# Patient Record
Sex: Female | Born: 1995 | Race: Black or African American | Hispanic: No | Smoking: Former smoker
Health system: Southern US, Community
[De-identification: ages and names within clinical notes are randomized; demographics above are authoritative.]

## PROBLEM LIST (undated history)

## (undated) DIAGNOSIS — D649 Anemia, unspecified: Secondary | ICD-10-CM

## (undated) DIAGNOSIS — O139 Gestational [pregnancy-induced] hypertension without significant proteinuria, unspecified trimester: Secondary | ICD-10-CM

## (undated) DIAGNOSIS — I10 Essential (primary) hypertension: Secondary | ICD-10-CM

## (undated) HISTORY — DX: Anemia, unspecified: D64.9

## (undated) HISTORY — PX: NO PAST SURGERIES: SHX2092

## (undated) HISTORY — DX: Gestational (pregnancy-induced) hypertension without significant proteinuria, unspecified trimester: O13.9

## (undated) HISTORY — DX: Essential (primary) hypertension: I10

---

## 2020-03-27 NOTE — L&D Delivery Note (Signed)
Delivery Note Arrived on stretcher with urge to push.  Reluctant to move to bed.  AROM light Meconium stained fluid.  Finally convinced her to move to bed where she quickly delivered with NICU attending due to MSAF.  At 2:15 AM a viable and healthy female was delivered via Vaginal, Spontaneous (Presentation: Left Occiput Anterior). Delivered en caul.   Baby covered in thick meconium, slow to cry.  Cord clamped and cut and baby handed to [facility] team for care.   They provided care and took him to NICU.  APGAR: 7, 9; weight 6 lb 12.3 oz (3070 g).   Placenta status: Spontaneous;Pathology, Intact.  Cord: 3 vessels with the following complications: None.  Cord pH: 7.386  Anesthesia: None Episiotomy: None Lacerations: None Suture Repair:  none Est. Blood Loss (mL): 500  Mom to postpartum.  Baby to NICU.  Wynelle Bourgeois 03/20/2021, 3:36 AM  Please schedule this patient for Postpartum visit in: 1 week with the following provider: Any provider In-Person For C/S patients schedule nurse incision check in weeks 2 weeks: no High risk pregnancy complicated by: HTN Delivery mode:  SVD Anticipated Birth Control:  Nexplanon PP Procedures needed: BP check  Edinburgh:  pending, but has history of recent suicidal ideations and depression.   Schedule Integrated BH visit: yes  NICU relevant baby issues

## 2020-07-28 ENCOUNTER — Inpatient Hospital Stay (HOSPITAL_COMMUNITY): Payer: Medicaid Other

## 2020-07-28 ENCOUNTER — Inpatient Hospital Stay (HOSPITAL_COMMUNITY)
Admission: AD | Admit: 2020-07-28 | Discharge: 2020-07-28 | Disposition: A | Discharge status: Home / Self Care | Payer: Medicaid Other | Attending: Obstetrics & Gynecology | Admitting: Obstetrics & Gynecology

## 2020-07-28 ENCOUNTER — Encounter (HOSPITAL_COMMUNITY): Payer: Self-pay | Admitting: Obstetrics & Gynecology

## 2020-07-28 DIAGNOSIS — O219 Vomiting of pregnancy, unspecified: Secondary | ICD-10-CM

## 2020-07-28 DIAGNOSIS — O26811 Pregnancy related exhaustion and fatigue, first trimester: Secondary | ICD-10-CM | POA: Diagnosis not present

## 2020-07-28 DIAGNOSIS — R519 Headache, unspecified: Secondary | ICD-10-CM | POA: Diagnosis present

## 2020-07-28 DIAGNOSIS — O26891 Other specified pregnancy related conditions, first trimester: Secondary | ICD-10-CM

## 2020-07-28 DIAGNOSIS — O210 Mild hyperemesis gravidarum: Secondary | ICD-10-CM

## 2020-07-28 DIAGNOSIS — Z3490 Encounter for supervision of normal pregnancy, unspecified, unspecified trimester: Secondary | ICD-10-CM

## 2020-07-28 DIAGNOSIS — Z3A01 Less than 8 weeks gestation of pregnancy: Secondary | ICD-10-CM | POA: Diagnosis not present

## 2020-07-28 DIAGNOSIS — R825 Elevated urine levels of drugs, medicaments and biological substances: Secondary | ICD-10-CM

## 2020-07-28 DIAGNOSIS — R109 Unspecified abdominal pain: Secondary | ICD-10-CM

## 2020-07-28 DIAGNOSIS — F1990 Other psychoactive substance use, unspecified, uncomplicated: Secondary | ICD-10-CM

## 2020-07-28 DIAGNOSIS — O99891 Other specified diseases and conditions complicating pregnancy: Secondary | ICD-10-CM

## 2020-07-28 LAB — COMPREHENSIVE METABOLIC PANEL
ALT: 13 U/L (ref 0–44)
AST: 22 U/L (ref 15–41)
Albumin: 3.9 g/dL (ref 3.5–5.0)
Alkaline Phosphatase: 48 U/L (ref 38–126)
Anion gap: 9 (ref 5–15)
BUN: 9 mg/dL (ref 6–20)
CO2: 21 mmol/L — ABNORMAL LOW (ref 22–32)
Calcium: 9.3 mg/dL (ref 8.9–10.3)
Chloride: 105 mmol/L (ref 98–111)
Creatinine, Ser: 0.72 mg/dL (ref 0.44–1.00)
GFR, Estimated: >60 mL/min (ref >60)
Glucose, Bld: 82 mg/dL (ref 70–99)
Potassium: 3.9 mmol/L (ref 3.5–5.1)
Sodium: 135 mmol/L (ref 135–145)
Total Bilirubin: 1.3 mg/dL — ABNORMAL HIGH (ref 0.3–1.2)
Total Protein: 7.4 g/dL (ref 6.5–8.1)

## 2020-07-28 LAB — CBC
HCT: 38 % (ref 36.0–46.0)
Hemoglobin: 12.1 g/dL (ref 12.0–15.0)
MCH: 28.1 pg (ref 26.0–34.0)
MCHC: 31.8 g/dL (ref 30.0–36.0)
MCV: 88.4 fL (ref 80.0–100.0)
Platelets: 225 10*3/uL (ref 150–400)
RBC: 4.3 MIL/uL (ref 3.87–5.11)
RDW: 13.6 % (ref 11.5–15.5)
WBC: 5.7 10*3/uL (ref 4.0–10.5)
nRBC: 0 % (ref 0.0–0.2)

## 2020-07-28 LAB — HCG, QUANTITATIVE, PREGNANCY: hCG, Beta Chain, Quant, S: 41904 m[IU]/mL — ABNORMAL HIGH (ref <5)

## 2020-07-28 LAB — RAPID URINE DRUG SCREEN, HOSP PERFORMED
Amphetamines: POSITIVE — AB
Barbiturates: NOT DETECTED
Benzodiazepines: NOT DETECTED
Cocaine: NOT DETECTED
Opiates: NOT DETECTED
Tetrahydrocannabinol: POSITIVE — AB

## 2020-07-28 LAB — URINALYSIS, ROUTINE W REFLEX MICROSCOPIC
Bilirubin Urine: NEGATIVE
Glucose, UA: NEGATIVE mg/dL
Hgb urine dipstick: NEGATIVE
Ketones, ur: NEGATIVE mg/dL
Nitrite: NEGATIVE
Protein, ur: 30 mg/dL — AB
Specific Gravity, Urine: 1.033 — ABNORMAL HIGH (ref 1.005–1.030)
pH: 5 (ref 5.0–8.0)

## 2020-07-28 LAB — WET PREP, GENITAL
Sperm: NONE SEEN
Trich, Wet Prep: NONE SEEN
Yeast Wet Prep HPF POC: NONE SEEN

## 2020-07-28 LAB — ABO/RH: ABO/RH(D): A POS

## 2020-07-28 LAB — POCT PREGNANCY, URINE: Preg Test, Ur: POSITIVE — AB

## 2020-07-28 MED ORDER — PYRIDOXINE HCL 25 MG PO TABS: 25.0000 mg | ORAL_TABLET | Freq: Three times a day (TID) | ORAL | 0 refills | Status: AC

## 2020-07-28 MED ORDER — SODIUM CHLORIDE 0.9 % IV SOLN
25.0000 mg | Freq: Once | INTRAVENOUS | Status: AC
  Administered 2020-07-28: 25 mg via INTRAVENOUS
  Filled 2020-07-28: qty 1

## 2020-07-28 MED ORDER — LACTATED RINGERS IV BOLUS
1000.0000 mL | Freq: Once | INTRAVENOUS | Status: AC
  Administered 2020-07-28: 1000 mL via INTRAVENOUS

## 2020-07-28 MED ORDER — PROMETHAZINE HCL 12.5 MG PO TABS: 12.5000 mg | ORAL_TABLET | Freq: Four times a day (QID) | ORAL | 0 refills | Status: DC | PRN

## 2020-07-28 MED ORDER — DOXYLAMINE SUCCINATE (SLEEP) 25 MG PO TABS: 25.0000 mg | ORAL_TABLET | Freq: Three times a day (TID) | ORAL | 0 refills | Status: DC | PRN

## 2020-07-28 NOTE — MAU Provider Note (Signed)
History     CSN: [id number]  Arrival date and time: 07/28/20 1345   Event Date/Time   First Provider Initiated Contact with Patient 07/28/20 1440      Chief Complaint  Patient presents with  . Headache  . Fatigue  . Possible Pregnancy   HPI Nichole Barrett is a 25 y.o. G1P0 at [redacted]w[redacted]d who presents to MAU with chief complaints of headache, fatigue, nausea and vomiting in the setting of positive home pregnancy test. She denies abdominal pain, vaginal bleeding, dysuria, fever.  Headache The is a recurrent problem. Pain score on arrival to MAU is 9/10. She attempted to manage her pain with "non-Aspirin" but did not experience relief. Patient noted to be extremely somnolent, intermittently sleeping in chair in MAU waiting room.   Nausea and Vomiting New problem, has vomited twice today. No access to antiemetics, no other treaments. Unable to tolerate anything PO.  Fatigue This is a new problem, onset with pregnancy.   OB History    Gravida  1   Para      Term      Preterm      AB      Living        SAB      IAB      Ectopic      Multiple      Live Births             No family history on file.     Allergies: No Known Allergies  No medications prior to admission.    Review of Systems  Constitutional: Positive for fatigue.  Gastrointestinal: Positive for nausea and vomiting.  Neurological: Positive for headaches.  All other systems reviewed and are negative.  Physical Exam   Blood pressure (!) 110/58, pulse 76, temperature 98 F (36.7 C), temperature source Oral, resp. rate 16, height 5\' 7"  (1.702 m), weight 88.6 kg, last menstrual period 06/21/2020, SpO2 99 %.  Physical Exam Vitals and nursing note reviewed.  Cardiovascular:     Rate and Rhythm: Normal rate.     Heart sounds: Normal heart sounds.  Pulmonary:     Effort: Pulmonary effort is normal.     Breath sounds: Normal breath sounds.  Abdominal:     General: Bowel sounds are normal.      Palpations: Abdomen is soft.     Tenderness: There is no abdominal tenderness. There is no guarding.  Skin:    General: Skin is dry.     Capillary Refill: Capillary refill takes less than 2 seconds.  Neurological:     Mental Status: She is lethargic.    MAU Course  Procedures  Patient Vitals for the past 24 hrs:  BP Temp Temp src Pulse Resp SpO2 Height Weight  07/28/20 1735 121/64 -- -- 78 15 -- -- --  07/28/20 1406 (!) 110/58 98 F (36.7 C) Oral 76 16 99 % 5\' 7"  (1.702 m) 88.6 kg   Results for orders placed or performed during the hospital encounter of 07/28/20 (from the past 24 hour(s))  Pregnancy, urine POC     Status: Abnormal   Collection Time: 07/28/20  2:17 PM  Result Value Ref Range   Preg Test, Ur POSITIVE (A) NEGATIVE  CBC     Status: None   Collection Time: 07/28/20  2:58 PM  Result Value Ref Range   WBC 5.7 4.0 - 10.5 K/uL   RBC 4.30 3.87 - 5.11 MIL/uL   Hemoglobin 12.1 12.0 - 15.0 g/dL  HCT 38.0 36.0 - 46.0 %   MCV 88.4 80.0 - 100.0 fL   MCH 28.1 26.0 - 34.0 pg   MCHC 31.8 30.0 - 36.0 g/dL   RDW 09.8 11.9 - 14.7 %   Platelets 225 150 - 400 K/uL   nRBC 0.0 0.0 - 0.2 %  Comprehensive metabolic panel     Status: Abnormal   Collection Time: 07/28/20  2:58 PM  Result Value Ref Range   Sodium 135 135 - 145 mmol/L   Potassium 3.9 3.5 - 5.1 mmol/L   Chloride 105 98 - 111 mmol/L   CO2 21 (L) 22 - 32 mmol/L   Glucose, Bld 82 70 - 99 mg/dL   BUN 9 6 - 20 mg/dL   Creatinine, Ser 8.29 0.44 - 1.00 mg/dL   Calcium 9.3 8.9 - 56.2 mg/dL   Total Protein 7.4 6.5 - 8.1 g/dL   Albumin 3.9 3.5 - 5.0 g/dL   AST 22 15 - 41 U/L   ALT 13 0 - 44 U/L   Alkaline Phosphatase 48 38 - 126 U/L   Total Bilirubin 1.3 (H) 0.3 - 1.2 mg/dL   GFR, Estimated >13 >08 mL/min   Anion gap 9 5 - 15  hCG, quantitative, pregnancy     Status: Abnormal   Collection Time: 07/28/20  2:58 PM  Result Value Ref Range   hCG, Beta Chain, Quant, S 41,904 (H) <5 mIU/mL  Urinalysis, Routine w reflex  microscopic Urine, Clean Catch     Status: Abnormal   Collection Time: 07/28/20  2:59 PM  Result Value Ref Range   Color, Urine AMBER (A) YELLOW   APPearance HAZY (A) CLEAR   Specific Gravity, Urine 1.033 (H) 1.005 - 1.030   pH 5.0 5.0 - 8.0   Glucose, UA NEGATIVE NEGATIVE mg/dL   Hgb urine dipstick NEGATIVE NEGATIVE   Bilirubin Urine NEGATIVE NEGATIVE   Ketones, ur NEGATIVE NEGATIVE mg/dL   Protein, ur 30 (A) NEGATIVE mg/dL   Nitrite NEGATIVE NEGATIVE   Leukocytes,Ua SMALL (A) NEGATIVE   WBC, UA 0-5 0 - 5 WBC/hpf   Bacteria, UA FEW (A) NONE SEEN   Squamous Epithelial / LPF 6-10 0 - 5   Mucus PRESENT   Urine rapid drug screen (hosp performed)     Status: Abnormal   Collection Time: 07/28/20  2:59 PM  Result Value Ref Range   Opiates NONE DETECTED NONE DETECTED   Cocaine NONE DETECTED NONE DETECTED   Benzodiazepines NONE DETECTED NONE DETECTED   Amphetamines POSITIVE (A) NONE DETECTED   Tetrahydrocannabinol POSITIVE (A) NONE DETECTED   Barbiturates NONE DETECTED NONE DETECTED  ABO/Rh     Status: None   Collection Time: 07/28/20  2:59 PM  Result Value Ref Range   ABO/RH(D) A POS    No rh immune globuloin      NOT A RH IMMUNE GLOBULIN CANDIDATE, PT RH POSITIVE Performed at [facility], [address], [place], [place] [address]   Wet prep, genital     Status: Abnormal   Collection Time: 07/28/20  3:44 PM   Specimen: Vaginal  Result Value Ref Range   Yeast Wet Prep HPF POC NONE SEEN NONE SEEN   Trich, Wet Prep NONE SEEN NONE SEEN   Clue Cells Wet Prep HPF POC PRESENT (A) NONE SEEN   WBC, Wet Prep HPF POC MANY (A) NONE SEEN   Sperm NONE SEEN    US OB LESS THAN 14 WEEKS WITH OB TRANSVAGINAL  Result  Date: 07/28/2020 CLINICAL DATA:  Abdominal pain affecting first trimester of pregnancy, weakness, LMP 06/21/2020; no quantitative beta HCG yet available for correlation EXAM: OBSTETRIC <14 WK [place] AND TRANSVAGINAL OB US TECHNIQUE: Both transabdominal and transvaginal ultrasound  examinations were performed for complete evaluation of the gestation as well as the maternal uterus, adnexal regions, and pelvic cul-de-sac. Transvaginal technique was performed to assess early pregnancy. COMPARISON:  None FINDINGS: Intrauterine gestational sac: Present, single Yolk sac:  Present Embryo:  Present Cardiac Activity: Present Heart Rate: 117 bpm CRL:  3.9 mm   6 w   0 d                  [place] EDC: 03/23/2021 Subchorionic hemorrhage:  None visualized. Maternal uterus/adnexa: Uterus anteverted, otherwise normal appearance. RIGHT ovary normal size and morphology, 4.2 x 2.6 x 2.9 cm. LEFT ovary normal size and morphology, 3.6 x 1.8 x 2.1 cm. No free pelvic fluid or adnexal masses. IMPRESSION: Single live intrauterine gestation at 6 weeks 0 days EGA by crown-rump length. No acute abnormalities. Electronically Signed   By: Ulyses Southward M.D.   On: 07/28/2020 15:49   Meds ordered this encounter  Medications  . lactated ringers bolus 1,000 mL  . promethazine (PHENERGAN) 25 mg in sodium chloride 0.9 % 50 mL IVPB  . pyridOXINE (VITAMIN B-6) 25 MG tablet    Sig: Take 1 tablet (25 mg total) by mouth every 8 (eight) hours.    Dispense:  30 tablet    Refill:  0    Order Specific Question:   Supervising Provider    Answer:   Jaynie Collins A [[id number]]  . doxylamine, Sleep, (UNISOM) 25 MG tablet    Sig: Take 1 tablet (25 mg total) by mouth every 8 (eight) hours as needed.    Dispense:  30 tablet    Refill:  0    Order Specific Question:   Supervising Provider    Answer:   Jaynie Collins A [[id number]]  . promethazine (PHENERGAN) 12.5 MG tablet    Sig: Take 1 tablet (12.5 mg total) by mouth every 6 (six) hours as needed for nausea or vomiting.    Dispense:  30 tablet    Refill:  0    Order Specific Question:   Supervising Provider    Answer:   Jaynie Collins A [[id number]]   Assessment and Plan  --25 y.o. G1P0 with confirmed IUP at 6w 0d --Vomiting in first trimester without ketonuria --+ THC, + Amphetamines.  Pt denies use --New outpatient regimen to manage vomiting --Discussed Cannabis Hyperemesis --No episodes of vomiting in MAU --Pain score 0 at time of discharge, tolerating PO --Discharge home in stable condition  F/U: --Patient to select [facility] Provider. Given list of OBs with [facility] privileges   Calvert Cantor, CNM 07/28/2020, 8:41 PM

## 2020-07-28 NOTE — MAU Note (Signed)
Feels really, really weak (4-5 days).  Head is hurting (4-5 days). Just found out she is preg, +HPT x2. Denies bleeding. Denies pain except HA. Took 1 'non-aspirin'.  Denies hx of headaches.

## 2020-07-28 NOTE — Discharge Instructions (Signed)
Prenatal Care Providers           Center for [facility]  [address] [phone]  Center for [facility] @ [facility]   [address]  [phone]  [facility] @ [facility]       [address] [phone]            Center for [facility] @ [facility]     [phone]          Center for [facility] @ [facility]   [address] [phone]  Center for [facility] @ [facility]  [address] [phone]     Center for [facility] @ Family [address] [place])  [address]   [phone]     [facility]  Phone: [phone]  [facility]  Phone: [phone]  [facility] Phone: [phone]  [facility] Phone: [phone]  [facility] Phone: [phone]  [facility] Phone: [phone]  [facility]  Phone: [phone]  Safe Medications in Pregnancy   Acne: Benzoyl Peroxide Salicylic Acid  Backache/Headache: Tylenol: 2 regular strength every 4 hours OR              2 Extra strength every 6 hours  Colds/Coughs/Allergies: Benadryl (alcohol free) 25 mg every 6 hours as needed Breath right strips Claritin Cepacol throat lozenges Chloraseptic throat spray Cold-Eeze- up to three times per day Cough drops, alcohol free Flonase (by prescription only) Guaifenesin Mucinex Robitussin DM (plain only, alcohol free) Saline nasal spray/drops Sudafed (pseudoephedrine) & Actifed ** use only after [redacted] weeks gestation and if you do not have high blood pressure Tylenol Vicks Vaporub Zinc lozenges Zyrtec   Constipation: Colace Ducolax suppositories Fleet enema Glycerin suppositories Metamucil Milk of magnesia Miralax Senokot Smooth move tea  Diarrhea: Kaopectate Imodium A-D  *NO pepto  Bismol  Hemorrhoids: Anusol Anusol HC Preparation H Tucks  Indigestion: Tums Maalox Mylanta Zantac  Pepcid  Insomnia: Benadryl (alcohol free) 25mg  every 6 hours as needed Tylenol PM Unisom, no Gelcaps  Leg Cramps: Tums MagGel  Nausea/Vomiting:  Bonine Dramamine Emetrol Ginger extract Sea bands Meclizine  Nausea medication to take during pregnancy:  Unisom (doxylamine succinate 25 mg tablets) Take one tablet daily at bedtime. If symptoms are not adequately controlled, the dose can be increased to a maximum recommended dose of two tablets daily (1/2 tablet in the morning, 1/2 tablet mid-afternoon and one at bedtime). Vitamin B6 100mg  tablets. Take one tablet twice a day (up to 200 mg per day).  Skin Rashes: Aveeno products Benadryl cream or 25mg  every 6 hours as needed Calamine Lotion 1% cortisone cream  Yeast infection: Gyne-lotrimin 7 Monistat 7   **If taking multiple medications, please check labels to avoid duplicating the same active ingredients **take medication as directed on the label ** Do not exceed 4000 mg of tylenol in 24 hours **Do not take medications that contain aspirin or ibuprofen     .html">  First Trimester of Pregnancy  The first trimester of pregnancy starts on the first day of your last menstrual period until the end of week 12. This is also called months 1 through 3 of pregnancy. Body changes during your first trimester Your body goes through many changes during pregnancy. The changes usually return to normal after your baby is born. Physical changes  You may gain or  lose weight.  Your breasts may grow larger and hurt. The area around your nipples may get darker.  Dark spots or blotches may develop on your face.  You may have changes in your hair. Health changes  You may feel like you might vomit (nauseous), and you may vomit.  You may have heartburn.  You may have  headaches.  You may have trouble pooping (constipation).  Your gums may bleed. Other changes  You may get tired easily.  You may pee (urinate) more often.  Your menstrual periods will stop.  You may not feel hungry.  You may want to eat certain kinds of food.  You may have changes in your emotions from day to day.  You may have more dreams. Follow these instructions at home: Medicines  Take over-the-counter and prescription medicines only as told by your doctor. Some medicines are not safe during pregnancy.  Take a prenatal vitamin that contains at least 600 micrograms (mcg) of folic acid. Eating and drinking  Eat healthy meals that include: ? Fresh fruits and vegetables. ? Whole grains. ? Good sources of protein, such as meat, eggs, or tofu. ? Low-fat dairy products.  Avoid raw meat and unpasteurized juice, milk, and cheese.  If you feel like you may vomit, or you vomit: ? Eat 4 or 5 small meals a day instead of 3 large meals. ? Try eating a few soda crackers. ? Drink liquids between meals instead of during meals.  You may need to take these actions to prevent or treat trouble pooping: ? Drink enough fluids to keep your pee (urine) pale yellow. ? Eat foods that are high in fiber. These include beans, whole grains, and fresh fruits and vegetables. ? Limit foods that are high in fat and sugar. These include fried or sweet foods. Activity  Exercise only as told by your doctor. Most people can do their usual exercise routine during pregnancy.  Stop exercising if you have cramps or pain in your lower belly (abdomen) or low back.  Do not exercise if it is too hot or too humid, or if you are in a place of great height (high altitude).  Avoid heavy lifting.  If you choose to, you may have sex unless your doctor tells you not to. Relieving pain and discomfort  Wear a good support bra if your breasts are sore.  Rest with your legs raised (elevated) if you have leg  cramps or low back pain.  If you have bulging veins (varicose veins) in your legs: ? Wear support hose as told by your doctor. ? Raise your feet for 15 minutes, 3-4 times a day. ? Limit salt in your food. Safety  Wear your seat belt at all times when you are in a car.  Talk with your doctor if someone is hurting you or yelling at you.  Talk with your doctor if you are feeling sad or have thoughts of hurting yourself. Lifestyle  Do not use hot tubs, steam rooms, or saunas.  Do not douche. Do not use tampons or scented sanitary pads.  Do not use herbal medicines, illegal drugs, or medicines that are not approved by your doctor. Do not drink alcohol.  Do not smoke or use any products that contain nicotine or tobacco. If you need help quitting, ask your doctor.  Avoid cat litter boxes and soil that is used by cats. These carry germs that can cause harm to the baby and can cause a loss of your baby  by miscarriage or stillbirth. General instructions  Keep all follow-up visits. This is important.  Ask for help if you need counseling or if you need help with nutrition. Your doctor can give you advice or tell you where to go for help.  Visit your dentist. At home, brush your teeth with a soft toothbrush. Floss gently.  Write down your questions. Take them to your prenatal visits. Where to find more information  American Pregnancy Association: americanpregnancy.org  [employer]ians [employer]necologists: www.acog.org  Office on Women's Health: MightyReward.co.nz Contact a doctor if:  You are dizzy.  You have a fever.  You have mild cramps or pressure in your lower belly.  You have a nagging pain in your belly area.  You continue to feel like you may vomit, you vomit, or you have watery poop (diarrhea) for 24 hours or longer.  You have a bad-smelling fluid coming from your vagina.  You have pain when you pee.  You are exposed to a disease that  spreads from person to person, such as chickenpox, measles, Zika virus, HIV, or hepatitis. Get help right away if:  You have spotting or bleeding from your vagina.  You have very bad belly cramping or pain.  You have shortness of breath or chest pain.  You have any kind of injury, such as from a fall or a car crash.  You have new or increased pain, swelling, or redness in an arm or leg. Summary  The first trimester of pregnancy starts on the first day of your last menstrual period until the end of week 12 (months 1 through 3).  Eat 4 or 5 small meals a day instead of 3 large meals.  Do not smoke or use any products that contain nicotine or tobacco. If you need help quitting, ask your doctor.  Keep all follow-up visits. This information is not intended to replace advice given to you by your health care provider. Make sure you discuss any questions you have with your health care provider. Document Revised: 08/20/2019 Document Reviewed: 06/26/2019 Elsevier Patient Education  2021 [employer].

## 2020-07-29 LAB — GC/CHLAMYDIA PROBE AMP (~~LOC~~) NOT AT ARMC
Chlamydia: NEGATIVE
Comment: NEGATIVE
Comment: NORMAL
Neisseria Gonorrhea: NEGATIVE

## 2020-08-30 ENCOUNTER — Telehealth (INDEPENDENT_AMBULATORY_CARE_PROVIDER_SITE_OTHER): Payer: Medicaid Other | Admitting: *Deleted

## 2020-08-30 ENCOUNTER — Encounter: Payer: Self-pay | Admitting: *Deleted

## 2020-08-30 DIAGNOSIS — D649 Anemia, unspecified: Secondary | ICD-10-CM | POA: Insufficient documentation

## 2020-08-30 DIAGNOSIS — F3289 Other specified depressive episodes: Secondary | ICD-10-CM

## 2020-08-30 DIAGNOSIS — F418 Other specified anxiety disorders: Secondary | ICD-10-CM

## 2020-08-30 DIAGNOSIS — F32A Depression, unspecified: Secondary | ICD-10-CM | POA: Insufficient documentation

## 2020-08-30 DIAGNOSIS — D508 Other iron deficiency anemias: Secondary | ICD-10-CM

## 2020-08-30 DIAGNOSIS — Z8759 Personal history of other complications of pregnancy, childbirth and the puerperium: Secondary | ICD-10-CM | POA: Insufficient documentation

## 2020-08-30 DIAGNOSIS — O99340 Other mental disorders complicating pregnancy, unspecified trimester: Secondary | ICD-10-CM

## 2020-08-30 DIAGNOSIS — O0990 Supervision of high risk pregnancy, unspecified, unspecified trimester: Secondary | ICD-10-CM

## 2020-08-30 DIAGNOSIS — O99320 Drug use complicating pregnancy, unspecified trimester: Secondary | ICD-10-CM

## 2020-08-30 DIAGNOSIS — O09299 Supervision of pregnancy with other poor reproductive or obstetric history, unspecified trimester: Secondary | ICD-10-CM

## 2020-08-30 DIAGNOSIS — Z3A00 Weeks of gestation of pregnancy not specified: Secondary | ICD-10-CM

## 2020-08-30 DIAGNOSIS — F1990 Other psychoactive substance use, unspecified, uncomplicated: Secondary | ICD-10-CM

## 2020-08-30 MED ORDER — BLOOD PRESSURE KIT DEVI: 1.0000 | 0 refills | Status: DC | PRN

## 2020-08-30 NOTE — Patient Instructions (Addendum)
  At our [facility] OB/GYN Practices, we work as an integrated team, providing care to address both physical and emotional health. Your medical provider may refer you to see our Behavioral Health Clinician [facility]) on the same day you see your medical provider, as availability permits; often scheduled virtually at your convenience.  Our [facility] is available to all patients, visits generally last between 20-30 minutes, but can be longer or shorter, depending on patient need. The [facility] offers help with stress management, coping with symptoms of depression and anxiety, major life changes , sleep issues, changing risky behavior, grief and loss, life stress, working on personal life goals, and  behavioral health issues, as these all affect your overall health and wellness.  The [facility] is NOT available for the following: FMLA paperwork, court-ordered evaluations, specialty assessments (custody or disability), letters to employers, or obtaining certification for an emotional support animal. The [facility] does not provide long-term therapy. You have the right to refuse integrated behavioral health services, or to reschedule to see the [facility] at a later date.  Confidentiality exception: If it is suspected that a child or disabled adult is being abused or neglected, we are required by law to report that to either Child Protective Services or Adult Management consultant.  If you have a diagnosis of Bipolar affective disorder, Schizophrenia, or recurrent Major depressive disorder, we will recommend that you establish care with a psychiatrist, as these are lifelong, chronic conditions, and we want your overall emotional health and medications to be more closely monitored. If you anticipate needing extended maternity leave due to mental health issues postpartum, it it recommended you inform your medical provider, so we can put in a referral to a psychiatrist as soon as possible. The [facility] is unable to recommend an extended maternity leave for mental  health issues. Your medical provider or [facility] may refer you to a therapist for ongoing, traditional therapy, or to a psychiatrist, for medication management, if it would benefit your overall health. Depending on your insurance, you may have a copay or be charged a deductible, depending on your insurance, to see the [facility]. If you are uninsured, it is recommended that you apply for financial assistance. (Forms may be requested at the front desk for in-person visits, via MyChart, or request a form during a virtual visit).  If you see the [facility] more than 6 times, you will have to complete a comprehensive clinical assessment interview with the [facility] to resume integrated services.  For virtual visits with the [facility], you must be physically in the state of [place] at the time of the visit. For example, if you live in [place], you will have to do an in-person visit with the [facility], and your out-of-state insurance may not cover behavioral health services in [place]. If you are going out of the state or country for any reason, the [facility] may see you virtually when you return to [place], but not while you are physically outside of [place].    If you are in need of transportation to get to and from your appointments in our office.  You can reach Transportation Services by calling [phone] Monday - Friday  7am-6pm.

## 2020-08-30 NOTE — Progress Notes (Signed)
New OB Intake  I connected with  Glenetta Hew on 08/30/20 at 10:15 AM EDT by MyChart video visit  and verified that I am speaking with the correct person using two identifiers. Nurse is located at [facility] and pt is located at home. We connected thru MyChart and were disconnected and reconected several times. Patient requested to continue visit via telephone.   I discussed the limitations, risks, security and privacy concerns of performing an evaluation and management service by Mychart video visit  and the availability of in person appointments. I also discussed with the patient that there may be a patient responsible charge related to this service. The patient expressed understanding and agreed to proceed.  I explained I am completing New OB Intake today. We discussed her EDD of 03/28/2021 that is based on LMP of 06/21/20. Pt is G3/P2002. I reviewed her allergies, medications, Medical/Surgical/OB history, and appropriate screenings. I informed her of [facility] services. Based on history, this is a/an pregnancy  complicated by Postiive drug screen, history Pregnancy induced hypertension, anxiety, depression.. Patient states she has not yet selected a pharmacy or went to a pharmacy to pick up her prescriptions. I advised her to select a pharmacy and let us know which she picked at her new ob visit.   Patient Active Problem List   Diagnosis Date Noted  . Supervision of high risk pregnancy, antepartum 08/30/2020  . History of pregnancy induced hypertension 08/30/2020  . Anemia   . Depression   . Intrauterine pregnancy 07/28/2020  . Substance use 07/28/2020    Concerns addressed today  Delivery Plans:  Plans to deliver at [facility].   MyChart/Babyscripts MyChart access verified. I explained pt will have some visits in office and some virtually. Babyscripts instructions given and order placed.   Blood Pressure Cuff Blood pressure cuff ordered for patient to pick-up from [employer]. Explained  after first prenatal appt pt will check weekly and document in Babyscripts.  Anatomy US Explained first scheduled [place] will be around 19 weeks. Patient will be notified by Mychart.   Labs Discussed Avelina Laine genetic screening with patient. Would like both Panorama and Horizon drawn at new OB visit. Routine prenatal labs needed.  Covid Vaccine Patient has not covid vaccine.   Social Determinants of Health . Food Insecurity: Patient expresses food insecurity. Food Market information given to patient; explained patient may visit at the end of first OB appointment. . WIC Referral: Patient is not interested in referral to [facility]. She states she has WIC already.  . Transportation: Patient expressed transportation needs. Transportation Services reviewed with patient; patient registered and phone number provided for patient to schedule rides. . Childcare: Discussed no children allowed at ultrasound appointments. Offered childcare services; patient declines childcare services at this time.  First visit review I reviewed new OB appt with pt. I explained she will have a pelvic exam, ob bloodwork with genetic screening, and PAP smear. Explained pt will be seen by Alverda Skeans, CNM at first visit; encounter routed to appropriate provider. Explained that patient will be seen by pregnancy navigator following visit with provider.  Ellyse Rotolo,RN 08/30/2020  4:28 PM

## 2020-09-20 ENCOUNTER — Encounter: Payer: Medicaid Other | Admitting: Student

## 2020-10-12 NOTE — Progress Notes (Signed)
.  cwhrn °

## 2020-11-01 ENCOUNTER — Ambulatory Visit: Payer: Medicaid Other | Admitting: *Deleted

## 2020-11-01 ENCOUNTER — Encounter: Payer: Self-pay | Admitting: *Deleted

## 2020-11-01 ENCOUNTER — Other Ambulatory Visit: Payer: Self-pay

## 2020-11-01 ENCOUNTER — Ambulatory Visit: Payer: Medicaid Other

## 2020-11-01 ENCOUNTER — Encounter: Payer: Self-pay | Admitting: Student

## 2020-11-01 ENCOUNTER — Ambulatory Visit: Payer: Medicaid Other | Attending: Student

## 2020-11-01 ENCOUNTER — Other Ambulatory Visit: Payer: Self-pay | Admitting: *Deleted

## 2020-11-01 VITALS — BP 114/60 | HR 92

## 2020-11-01 DIAGNOSIS — F1990 Other psychoactive substance use, unspecified, uncomplicated: Secondary | ICD-10-CM | POA: Diagnosis present

## 2020-11-01 DIAGNOSIS — O283 Abnormal ultrasonic finding on antenatal screening of mother: Secondary | ICD-10-CM

## 2020-11-01 DIAGNOSIS — Z8759 Personal history of other complications of pregnancy, childbirth and the puerperium: Secondary | ICD-10-CM

## 2020-11-01 DIAGNOSIS — O0990 Supervision of high risk pregnancy, unspecified, unspecified trimester: Secondary | ICD-10-CM | POA: Diagnosis not present

## 2020-11-01 DIAGNOSIS — O35BXX0 Maternal care for other (suspected) fetal abnormality and damage, fetal cardiac anomalies, not applicable or unspecified: Secondary | ICD-10-CM | POA: Insufficient documentation

## 2020-11-01 DIAGNOSIS — F1910 Other psychoactive substance abuse, uncomplicated: Secondary | ICD-10-CM

## 2020-11-01 DIAGNOSIS — D508 Other iron deficiency anemias: Secondary | ICD-10-CM | POA: Insufficient documentation

## 2020-11-01 DIAGNOSIS — O358XX0 Maternal care for other (suspected) fetal abnormality and damage, not applicable or unspecified: Secondary | ICD-10-CM | POA: Insufficient documentation

## 2020-11-10 ENCOUNTER — Telehealth: Payer: Self-pay | Admitting: Obstetrics and Gynecology

## 2020-11-10 NOTE — Telephone Encounter (Signed)
PC to Ms. Iser regarding MaterniT21 testing.  She left on 8/8 prior to having her blood drawn. I offered the option of returning and she would like to come back tomorrow, 11/11/20 at 8am for this lab visit.  A Lab Only visit was scheduled and the lab is aware that she will return.  Order are still active from the prior visit.  Cherly Anderson, MS, CGC

## 2020-11-11 ENCOUNTER — Ambulatory Visit: Payer: Medicaid Other | Attending: Obstetrics

## 2020-11-11 ENCOUNTER — Telehealth: Payer: Self-pay

## 2020-11-11 NOTE — Telephone Encounter (Signed)
FETAL ECHO SCHEDULED FOR 12/08/2020 W/DR SHEA@1030AM .

## 2020-11-30 ENCOUNTER — Ambulatory Visit: Payer: Medicaid Other

## 2020-12-01 ENCOUNTER — Telehealth: Payer: Self-pay

## 2020-12-01 NOTE — Telephone Encounter (Signed)
Mar/sw patient-advised no children allowed, pt concerned that they have 2 children and FOB wants to go back for [place] with her-explained children not allowed, he or another adult will need to sit in waiting area w/children-ph nbr and info given for [employer]

## 2020-12-13 ENCOUNTER — Ambulatory Visit: Payer: Medicaid Other | Attending: Obstetrics

## 2020-12-13 ENCOUNTER — Ambulatory Visit: Payer: Medicaid Other

## 2020-12-31 ENCOUNTER — Ambulatory Visit: Payer: Medicaid Other | Attending: Obstetrics

## 2020-12-31 ENCOUNTER — Ambulatory Visit: Payer: Medicaid Other

## 2021-01-10 NOTE — BH Specialist Note (Signed)
Integrated Behavioral Health via Telemedicine Visit  01/10/2021 [name] [id number]  Number of Integrated Behavioral Health visits: 1 Session Start time: 1:17  Session End time: 2:03 Total time:  46  Referring Provider: Luna Kitchens, CNM Patient/Family location: Home [facility] Provider location: [facility]  All persons participating in visit: Patient Nichole Barrett and [facility]   Types of Service: Individual psychotherapy and Video visit  I connected with Nichole Barrett and/or Nichole Barrett's  n/a  via  Telephone or Video Enabled Telemedicine Application  (Video is Caregility application) and verified that I am speaking with the correct person using two identifiers. Discussed confidentiality: Yes   I discussed the limitations of telemedicine and the availability of in person appointments.  Discussed there is a possibility of technology failure and discussed alternative modes of communication if that failure occurs.  I discussed that engaging in this telemedicine visit, they consent to the provision of behavioral healthcare and the services will be billed under their insurance.  Patient and/or legal guardian expressed understanding and consented to Telemedicine visit: Yes   Presenting Concerns: Patient and/or family reports the following symptoms/concerns: Life stress (juggling school classes with young children and pregnancy); nausea and physical discomfort in pregnancy; coping with marijuana; open to discussing healthy medication options at next medical visit to reduce substance use; goal is healthy pregnancy. Pt has passive SI when overwhelmed; no intent and no plan.  Duration of problem: Current pregnancy; Severity of problem: moderate  Patient and/or Family's Strengths/Protective Factors: Social connections, Concrete supports in place (healthy food, safe environments, etc.), and Sense of purpose  Goals  Addressed: Patient will:  Reduce symptoms of: anxiety, depression, and stress   Increase knowledge and/or ability of: coping skills, healthy habits, and stress reduction   Demonstrate ability to: Increase healthy adjustment to current life circumstances, Increase motivation to adhere to plan of care, and Decrease self-medicating behaviors  Progress towards Goals: Ongoing  Interventions: Interventions utilized:  Motivational Interviewing, Mindfulness or Management consultant, and Psychoeducation and/or Health Education Standardized Assessments completed: C-SSRS Short, GAD-7, and PHQ 9  Patient and/or Family Response: Pt agrees with treatment plan  Assessment: Patient currently experiencing Adjustment disorder with mixed anxious and depressed mood and Cannabis use disorder  Patient may benefit from psychoeducation and brief therapeutic interventions regarding coping with symptoms of depression, anxiety, life stress .  Plan: Follow up with behavioral health clinician on : Two weeks Behavioral recommendations:  -Begin taking prenatal vitamin daily -Discuss medication options with medical provider on 01/18/21 for managing nausea and back/leg pain in pregnancy -Consider CALM relaxation breathing exercise twice daily (morning; at bedtime with sleep sounds) for healthy self-coping with stress -Read through additional information on After Visit Summary (Safety if suicidal thoughts return; Postpartum Planner, apps; [email or web address] website)  Referral(s): Integrated [employer] (In Clinic)  I discussed the assessment and treatment plan with the patient and/or parent/guardian. They were provided an opportunity to ask questions and all were answered. They agreed with the plan and demonstrated an understanding of the instructions.   They were advised to call back or seek an in-person evaluation if the symptoms worsen or if the condition fails to improve as anticipated.  Rae Lips, LCSW  Depression screen [facility] 2/9 01/17/2021 08/30/2020  Decreased Interest 0 3  Down, Depressed, Hopeless 2 2  PHQ - 2 Score 2 5  Altered sleeping 1 2  Tired, decreased energy 1 1  Change in appetite 3  0  Feeling bad or failure about yourself  1 1  Trouble concentrating 1 1  Moving slowly or fidgety/restless 0 0  Suicidal thoughts 1 0  PHQ-9 Score 10 10   GAD 7 : Generalized Anxiety Score 01/17/2021 08/30/2020  Nervous, Anxious, on Edge 1 1  Control/stop worrying 1 2  Worry too much - different things 3 2  Trouble relaxing 1 2  Restless 0 0  Easily annoyed or irritable 1 3  Afraid - awful might happen 1 2  Total GAD 7 Score 8 12

## 2021-01-17 ENCOUNTER — Ambulatory Visit (INDEPENDENT_AMBULATORY_CARE_PROVIDER_SITE_OTHER): Payer: Medicaid Other | Admitting: Clinical

## 2021-01-17 DIAGNOSIS — O0990 Supervision of high risk pregnancy, unspecified, unspecified trimester: Secondary | ICD-10-CM

## 2021-01-17 DIAGNOSIS — F1290 Cannabis use, unspecified, uncomplicated: Secondary | ICD-10-CM

## 2021-01-17 DIAGNOSIS — F4323 Adjustment disorder with mixed anxiety and depressed mood: Secondary | ICD-10-CM

## 2021-01-17 NOTE — Patient Instructions (Addendum)
[facility] [place], [place] [address] [phone] (main office) [phone] [facility] office)  [email or web address] Manufacturing engineer video of [facility])  Behavioral Health Resources:   What if I or someone I know is in crisis?  If you are thinking about harming yourself or having thoughts of suicide, or if you know someone who is, seek help right away.  Call your doctor or mental health care provider.  Call 911 or go to a hospital emergency room to get immediate help, or ask a friend or family member to help you do these things; IF YOU ARE IN [place], YOU MAY GO TO WALK-IN URGENT CARE 24/7 at [facility] (see below)  Call the [place] National Suicide Prevention Lifeline's toll-free, 24-hour hotline at 1-800-273-TALK 980 459 7206) or TTY: 1-800-799-4 TTY 212-151-4456) to talk to a trained counselor.  If you are in crisis, make sure you are not left alone.   If someone else is in crisis, make sure he or she is not left alone   24 Hour :   [facility]  [address] [place], [place] [address] [phone] or [place] 24/7   [facility], [place], [place], [place] Fax: [phone] [email or web address] *Interpreters available *Accepts all insurance and uninsured for Urgent Care needs *Accepts Medicaid and uninsured for outpatient treatment      /Emotional Wellbeing Apps and Websites Here are a few free apps meant to help you to help yourself.  To find, try searching on the internet to see if the app is offered on Apple/Android devices. If your first choice doesn't come up on your device, the good news is that there are many choices! Play around with different apps to see which ones are helpful to you.    Calm This is an app meant to help increase calm feelings.  Includes info, strategies, and tools for tracking your feelings.      Calm Harm  This app is meant to help with self-harm. Provides many 5-minute or 15-min coping strategies for doing instead of hurting yourself.       Alpha is a problem-solving tool to help deal with emotions and cope with stress you encounter wherever you are.      MindShift This app can help people cope with anxiety. Rather than trying to avoid anxiety, you can make an important shift and face it.      MY3  MY3 features a support system, safety plan and resources with the goal of offering a tool to use in a time of need.       My Life My Voice  This mood journal offers a simple solution for tracking your thoughts, feelings and moods. Animated emoticons can help identify your mood.       Relax Melodies Designed to help with sleep, on this app you can mix sounds and meditations for relaxation.      Smiling Mind Smiling Mind is meditation made easy: it's a simple tool that helps put a smile on your mind.        Stop, Breathe & Think  A friendly, simple guide for people through meditations for mindfulness and compassion.  Stop, Breathe and Think Kids Enter your current feelings and choose a "mission" to help you cope. Offers videos for certain moods instead of just sound recordings.       Team [place] Tool Works  The goal of this tool is to help teens change how they think, act, and react. This app helps you focus on your own good feelings and experiences.      The [place] Box The [place] Box (VHB) contains simple tools to help patients with coping, relaxation, distraction, and positive thinking.       BRAINSTORMING  Develop a Plan Goals: Provide a way to start conversation about your new life with a baby Assist parents in recognizing and using resources within their reach Help pave the way before birth for an easier period of transition afterwards.  Make a list  of the following information to keep in a central location: Full name of Mom and Partner: _____________________________________________ 36 full name and Date of Birth: ___________________________________________ Home Address: ___________________________________________________________ ________________________________________________________________________ Home Phone: ____________________________________________________________ Parents' cell numbers: _____________________________________________________ ________________________________________________________________________ Name and contact info for OB: ______________________________________________ Name and contact info for Pediatrician:________________________________________ Contact info for Lactation Consultants: ________________________________________  REST and SLEEP *You each need at least 4-5 hours of uninterrupted sleep every day. Write specific names and contact information.* How are you going to rest in the postpartum period? While partner's home? When partner returns to work? When you both return to work? Where will your baby sleep? Who is available to help during the day? Evening? Night? Who could move in for a period to help support you? What are some ideas to help you get enough sleep? __________________________________________________________________________________________________________________________________________________________________________________________________________________________________________ NUTRITIOUS FOOD AND DRINK *Plan for meals before your baby is born so you can have healthy food to eat during the immediate postpartum period.* Who will look after breakfast? Lunch? Dinner? List names and contact information. Brainstorm quick, healthy ideas for each meal. What can you do before baby is born to prepare meals for the postpartum period? How can others help you with meals? Which grocery stores  provide online shopping and delivery? Which restaurants offer take-out or delivery options? ______________________________________________________________________________________________________________________________________________________________________________________________________________________________________________________________________________________________________________________________________________________________________________________________________  CARE FOR MOM *It's important that mom is cared for and pampered in the postpartum period. Remember, the most important ways new mothers need care are: sleep, nutrition, gentle exercise, and time off.* Who can come take care of mom during this period? Make a list of people with their contact information. List some activities that make you feel cared for, rested, and energized? Who can make sure you have opportunities to do these things? Does mom have a space of her very own within your home that's just for her? Make a "[facility]" where she can be comfortable, rest, and renew herself daily. ______________________________________________________________________________________________________________________________________________________________________________________________________________________________________________________________________________________________________________________________________________________________________________________________________    CARE FOR AND FEEDING BABY *Knowledgeable and encouraging people will offer the best support with regard to feeding your baby.* Educate yourself and choose the best feeding option for your baby. Make a list of people who will guide, support, and be a resource for you as your care for and feed your baby. (Friends that have breastfed or are currently breastfeeding, lactation consultants, breastfeeding support groups, etc.) Consider a postpartum doula.  (These websites can give you information: dona.org & BuyingShow.es) Seek out local breastfeeding resources like the breastfeeding support group at [employer] or [employer]. ______________________________________________________________________________________________________________________________________________________________________________________________________________________________________________________________________________________________________________________________________________________________________________________________________  Verner Chol AND ERRANDS Who can help with a thorough cleaning before baby is born? Make a list of people who will help with housekeeping and chores, like laundry, light cleaning, dishes, bathrooms, etc. Who can run some errands for you? What can you do to make sure you are stocked with basic supplies before baby is born? Who is going  to do the shopping? ______________________________________________________________________________________________________________________________________________________________________________________________________________________________________________________________________________________________________________________________________________________________________________________________________     Family Adjustment *Nurture yourselves.it helps parents be more loving and allows for better bonding with their child.* What sorts of things do you and partner enjoy doing together? Which activities help you to connect and strengthen your relationship? Make a list of those things. Make a list of people whom you trust to care for your baby so you can have some time together as a couple. What types of things help partner feel connected to Mom? Make a list. What needs will partner have in order to bond with baby? Other children? Who will care for them when you go into labor and while you are in the hospital? Think  about what the needs of your older children might be. Who can help you meet those needs? In what ways are you helping them prepare for bringing baby home? List some specific strategies you have for family adjustment. _______________________________________________________________________________________________________________________________________________________________________________________________________________________________________________________________________________________________________________________________________________  SUPPORT *Someone who can empathize with experiences normalizes your problems and makes them more bearable.* Make a list of other friends, neighbors, and/or co-workers you know with infants (and small children, if applicable) with whom you can connect. Make a list of local or online support groups, mom groups, etc. in which you can be involved. ______________________________________________________________________________________________________________________________________________________________________________________________________________________________________________________________________________________________________________________________________________________________________________________________________  Childcare Plans Investigate and plan for childcare if mom is returning to work. Talk about mom's concerns about her transition back to work. Talk about partner's concerns regarding this transition.  Mental Health *Your mental health is one of the highest priorities for a pregnant or postpartum mom.* 1 in 5 women experience anxiety and/or depression from the time of conception through the first year after birth. Postpartum Mood Disorders are the #1 complication of pregnancy and childbirth and the suffering experienced by these mothers is not necessary! These illnesses are temporary and respond well to treatment, which often includes  self-care, social support, talk therapy, and medication when needed. Women experiencing anxiety and depression often say things like: "I'm supposed to be happy.why do I feel so sad?", "Why can't I snap out of it?", "I'm having thoughts that scare me." There is no need to be embarrassed if you are feeling these symptoms: Overwhelmed, anxious, angry, sad, guilty, irritable, hopeless, exhausted but can't sleep You are NOT alone. You are NOT to blame. With help, you WILL be well. Where can I find help? Medical professionals such as your OB, midwife, gynecologist, family practitioner, primary care provider, pediatrician, or mental health providers; [facility] support groups: Feelings After Birth, Breastfeeding Support Group, Baby and Me Group, and Fit 4 Two exercise classes. You have permission to ask for help. It will confirm your feelings, validate your experiences, share/learn coping strategies, and gain support and encouragement as you heal. You are important! BRAINSTORM Make a list of local resources, including resources for mom and for partner. Identify support groups. Identify people to call late at night - include names and contact info. Talk with partner about perinatal mood and anxiety disorders. Talk with your OB, midwife, and doula about baby blues and about perinatal mood and anxiety disorders. Talk with your pediatrician about perinatal mood and anxiety disorders.   Support & Sanity Savers   What do you really need?  Basics In preparing for a new baby, many expectant parents spend hours shopping for baby clothes, decorating the nursery, and deciding which car seat to buy. Yet most don't think much about what the reality of parenting a newborn will be like, and what they need to make  it through that. So, here is the advice of experienced parents. We know you'll read this, and think "they're exaggerating, I don't really need that." Just trust [place] on these, OK? Plan for all of this,  and if it turns out you don't need it, come back and teach [place] how you did it!  Must-Haves (Once baby's survival needs are met, make sure you attend to your own survival needs!) Sleep An average newborn sleeps 16-18 hours per day, over 6-7 sleep periods, rarely more than three hours at a time. It is normal and healthy for a newborn to wake throughout the night... but really hard on parents!! Naps. Prioritize sleep above any responsibilities like: cleaning house, visiting friends, running errands, etc.  Sleep whenever baby sleeps. If you can't nap, at least have restful times when baby eats. The more rest you get, the more patient you will be, the more emotionally stable, and better at solving problems.  Food You may not have realized it would be difficult to eat when you have a newborn. Yet, when we talk to countless new parents, they say things like "it may be 2:00 pm when I realize I haven't had breakfast yet." Or "every time we sit down to dinner, baby needs to eat, and my food gets cold, so I don't bother to eat it." Finger food. Before your baby is born, stock up with one months' worth of food that: 1) you can eat with one hand while holding a baby, 2) doesn't need to be prepped, 3) is good hot or cold, 4) doesn't spoil when left out for a few hours, and 5) you like to eat. Think about: nuts, dried fruit, Clif bars, pretzels, jerky, gogurt, baby carrots, apples, bananas, crackers, cheez-n-crackers, string cheese, hot pockets or frozen burritos to microwave, garden burgers and breakfast pastries to put in the toaster, yogurt drinks, etc. Restaurant Menus. Make lists of your favorite restaurants & menu items. When family/friends want to help, you can give specific information without much thought. They can either bring you the food or send gift cards for just the right meals. Freezer Meals.  Take some time to make a few meals to put in the freezer ahead of time.  Easy to freeze meals can be anything  such as soup, lasagna, chicken pie, or spaghetti sauce. Set up a Meal Schedule.  Ask friends and family to sign up to bring you meals during the first few weeks of being home. (It can be passed around at baby showers!) You have no idea how helpful this will be until you are in the throes of parenting.  https://hamilton-woodard.[email or web address]/ is a great website to check out. Emotional Support Know who to call when you're stressed out. Parenting a newborn is very challenging work. There are times when it totally overwhelms your normal coping abilities. EVERY NEW PARENT NEEDS TO HAVE A PLAN FOR WHO TO CALL WHEN THEY JUST CAN'T COPE ANY MORE. (And it has to be someone other than the baby's other parent!) Before your baby is born, come up with at least one person you can call for support - write their phone number down and post it on the refrigerator. Anxiety & Sadness. Baby blues are normal after pregnancy; however, there are more severe types of anxiety & sadness which can occur and should not be ignored.  They are always treatable, but you have to take the first step by reaching out for help. [facility] offers a "Mom Talk" group which meets  every Tuesday from 10 am - 11 am.  This group is for new moms who need support and connection after their babies are born.  Call [phone].  Really, Really Helpful (Plan for them! Make sure these happen often!!) Physical Support with Taking Care of Yourselves Asking friends and family. Before your baby is born, set up a schedule of people who can come and visit and help out (or ask a friend to schedule for you). Any time someone says "let me know what I can do to help," sign them up for a day. When they get there, their job is not to take care of the baby (that's your job and your joy). Their job is to take care of you!  Postpartum doulas. If you don't have anyone you can call on for support, look into postpartum doulas:  professionals at helping parents with caring for baby, caring  for themselves, getting breastfeeding started, and helping with household tasks. www.padanc.org is a helpful website for learning about doulas in our area. Peer Support / Parent Groups Why: One of the greatest ideas for new parents is to be around other new parents. Parent groups give you a chance to share and listen to others who are going through the same season of life, get a sense of what is normal infant development by watching several babies learn and grow, share your stories of triumph and struggles with empathetic ears, and forgive your own mistakes when you realize all parents are learning by trial and error. Where to find: There are many places you can meet other new parents throughout our community.  [facility] offers the following classes for new moms and their little ones:  Baby and Me (Birth to [place]) and Breastfeeding Support Group. Go to www.conehealthybaby.com or call [phone] for more information. Time for your Relationship It's easy to get so caught up in meeting baby's immediate needs that it's hard to find time to connect with your partner, and meet the needs of your relationship. It's also easy to forget what "quality time with your partner" actually looks like. If you take your baby on a date, you'd be amazed how much of your couple time is spent feeding the baby, diapering the baby, admiring the baby, and talking about the baby. Dating: Try to take time for just the two of you. Babysitter tip: Sometimes when moms are breastfeeding a newborn, they find it hard to figure out how to schedule outings around baby's unpredictable feeding schedules. Have the babysitter come for a three hour period. When she comes over, if baby has just eaten, you can leave right away, and come back in two hours. If baby hasn't fed recently, you start the date at home. Once baby gets hungry and gets a good feeding in, you can head out for the rest of your date time. Date Nights at Home: If you  can't get out, at least set aside one evening a week to prioritize your relationship: whenever baby dozes off or doesn't have any immediate needs, spend a little time focusing on each other. Potential conflicts: The main relationship conflicts that come up for new parents are: issues related to sexuality, financial stresses, a feeling of an unfair division of household tasks, and conflicts in parenting styles. The more you can work on these issues before baby arrives, the better!  Fun and Frills (Don't forget these. and don't feel guilty for indulging in them!) Everyone has something in life that is a fun little treat  that they do just for themselves. It may be: reading the morning paper, or going for a daily jog, or having coffee with a friend once a week, or going to a movie on Friday nights, or fine chocolates, or bubble baths, or curling up with a good book. Unless you do fun things for yourself every now and then, it's hard to have the energy for fun with your baby. Whatever your "special" treats are, make sure you find a way to continue to indulge in them after your baby is born. These special moments can recharge you, and allow you to return to baby with a new joy   PERINATAL MOOD DISORDERS: [place]   _________________________________________Emergency and Crisis Resources If you are an imminent risk to self or others, are experiencing intense personal distress, and/or have noticed significant changes in activities of daily living, call:  [place]: [phone]  [address] [place], [place], [address] Mobile Crisis: [place]: 988 Or visit the following crisis centers: Local Emergency Departments [facility]: [address] [place]. Hours: 8:30AM-5PM. Insurance Accepted: Medicaid, Medicare, and Uninsured.  [facility]:  [address] [place]   Mon-Friday 8am-3pm, [phone]                                                                                  ___________ Non-Crisis Resources To identify specific providers that are covered by your insurance, contact your insurance company or local agencies:  [employer]: [phone] [facility] [employer]: [place]: [phone] Postpartum Support International- Warm-line: 534-583-7632                                                      __Outpatient Therapy and Medication Management   Providers:  [facility]: [phone] Hours: 9AM-5PM  Insurance Accepted: Alben Spittle, Shane Crutch, [place], [place] Total Access Care [facility] of Care): [phone] Hours: 8AM-5:30PM  nsurance Accepted: All insurances EXCEPT AARP, [place], [place], and [place]: [phone] Hours: 8AM-8PM Insurance Accepted: Cristal Ford, Freddrick March, [place], Medicare, [phone] [facility]: [phone] Hours: 8:30AM-7PM Insurance Accepted: Cristal Ford, Medicaid, Medicare, Tricare, [employer] Counseling:  [place] Accepted:  Holland Falling, Lorella Nimrod, [employer], [place]: [phone] Hours: 9AM-5:30PM Insurance Accepted: Alben Spittle, Charlotte Crumb, and Medicaid, Medicare, [facility]:  [phone] Hours: 9am-5pm Insurance Accepted: BCBS; they do not accept Medicaid/Medicare [facility] [place]: [phone] Hours: 9am-9pm Insurance Accepted: All major insurance including Medicaid and Medicare [facility]: [phone] Hours: [place] Accepted: All insurances EXCEPT Medicaid and Medicare. [facility]: [phone]   ____________  [facility] [place]: [phone] [employer] [place]:  [place]: (support for children in the NICU and/or with special needs), [phone]   ___________                                                                 Mental Health Support Groups Mental Health Association: [phone]    _____________                                                                                  Online Resources Postpartum Support International: http://jones-berg.com/  800-944-4PPD 2Moms Supporting Moms:  www.momssupportingmoms.net

## 2021-01-18 ENCOUNTER — Ambulatory Visit: Payer: Medicaid Other | Admitting: *Deleted

## 2021-01-18 ENCOUNTER — Encounter: Payer: Medicaid Other | Admitting: Family Medicine

## 2021-01-18 ENCOUNTER — Other Ambulatory Visit: Payer: Self-pay

## 2021-01-18 ENCOUNTER — Encounter: Payer: Self-pay | Admitting: *Deleted

## 2021-01-18 ENCOUNTER — Ambulatory Visit: Payer: Medicaid Other | Attending: Obstetrics

## 2021-01-18 VITALS — BP 121/62 | HR 85

## 2021-01-18 DIAGNOSIS — O283 Abnormal ultrasonic finding on antenatal screening of mother: Secondary | ICD-10-CM | POA: Diagnosis not present

## 2021-01-18 DIAGNOSIS — Z8759 Personal history of other complications of pregnancy, childbirth and the puerperium: Secondary | ICD-10-CM | POA: Diagnosis present

## 2021-01-18 DIAGNOSIS — O358XX0 Maternal care for other (suspected) fetal abnormality and damage, not applicable or unspecified: Secondary | ICD-10-CM | POA: Diagnosis not present

## 2021-01-18 DIAGNOSIS — F1910 Other psychoactive substance abuse, uncomplicated: Secondary | ICD-10-CM | POA: Diagnosis not present

## 2021-01-18 DIAGNOSIS — D508 Other iron deficiency anemias: Secondary | ICD-10-CM

## 2021-01-18 DIAGNOSIS — O99323 Drug use complicating pregnancy, third trimester: Secondary | ICD-10-CM | POA: Diagnosis not present

## 2021-01-18 DIAGNOSIS — O0990 Supervision of high risk pregnancy, unspecified, unspecified trimester: Secondary | ICD-10-CM | POA: Insufficient documentation

## 2021-01-18 DIAGNOSIS — Z3A30 30 weeks gestation of pregnancy: Secondary | ICD-10-CM

## 2021-01-18 NOTE — Progress Notes (Signed)
Advised to make appt with [facility]. Many "no shows" and canceled appt. Has not had GTT. Essentially no prenatal care.

## 2021-01-19 ENCOUNTER — Other Ambulatory Visit: Payer: Self-pay | Admitting: *Deleted

## 2021-01-19 DIAGNOSIS — O99320 Drug use complicating pregnancy, unspecified trimester: Secondary | ICD-10-CM

## 2021-01-20 NOTE — BH Specialist Note (Signed)
Pt did not arrive to video visit and did not answer the phone; Left HIPPA-compliant message to call back Avyana Puffenbarger from [facility] at  [phone] (Deone Leifheit's office).  ?; left MyChart message for patient.  ? ?

## 2021-01-22 ENCOUNTER — Encounter (HOSPITAL_COMMUNITY): Payer: Self-pay | Admitting: Obstetrics & Gynecology

## 2021-01-22 ENCOUNTER — Other Ambulatory Visit: Payer: Self-pay

## 2021-01-22 ENCOUNTER — Observation Stay (HOSPITAL_COMMUNITY)
Admission: AD | Admit: 2021-01-22 | Discharge: 2021-01-26 | Disposition: A | Discharge status: Home / Self Care | Payer: Medicaid Other | Attending: Obstetrics & Gynecology | Admitting: Obstetrics & Gynecology

## 2021-01-22 DIAGNOSIS — F323 Major depressive disorder, single episode, severe with psychotic features: Secondary | ICD-10-CM | POA: Insufficient documentation

## 2021-01-22 DIAGNOSIS — Z8759 Personal history of other complications of pregnancy, childbirth and the puerperium: Secondary | ICD-10-CM

## 2021-01-22 DIAGNOSIS — Z87891 Personal history of nicotine dependence: Secondary | ICD-10-CM | POA: Insufficient documentation

## 2021-01-22 DIAGNOSIS — Z9151 Personal history of suicidal behavior: Secondary | ICD-10-CM

## 2021-01-22 DIAGNOSIS — F32A Depression, unspecified: Secondary | ICD-10-CM

## 2021-01-22 DIAGNOSIS — F4310 Post-traumatic stress disorder, unspecified: Secondary | ICD-10-CM

## 2021-01-22 DIAGNOSIS — R45851 Suicidal ideations: Principal | ICD-10-CM

## 2021-01-22 DIAGNOSIS — Z3A30 30 weeks gestation of pregnancy: Secondary | ICD-10-CM

## 2021-01-22 DIAGNOSIS — O133 Gestational [pregnancy-induced] hypertension without significant proteinuria, third trimester: Secondary | ICD-10-CM | POA: Insufficient documentation

## 2021-01-22 DIAGNOSIS — Z3A31 31 weeks gestation of pregnancy: Secondary | ICD-10-CM | POA: Insufficient documentation

## 2021-01-22 DIAGNOSIS — O99343 Other mental disorders complicating pregnancy, third trimester: Secondary | ICD-10-CM | POA: Insufficient documentation

## 2021-01-22 DIAGNOSIS — D508 Other iron deficiency anemias: Secondary | ICD-10-CM

## 2021-01-22 DIAGNOSIS — F3289 Other specified depressive episodes: Secondary | ICD-10-CM

## 2021-01-22 DIAGNOSIS — O0990 Supervision of high risk pregnancy, unspecified, unspecified trimester: Secondary | ICD-10-CM

## 2021-01-22 DIAGNOSIS — Z20822 Contact with and (suspected) exposure to covid-19: Secondary | ICD-10-CM | POA: Insufficient documentation

## 2021-01-22 DIAGNOSIS — F322 Major depressive disorder, single episode, severe without psychotic features: Secondary | ICD-10-CM

## 2021-01-22 LAB — CBC
HCT: 35.3 % — ABNORMAL LOW (ref 36.0–46.0)
Hemoglobin: 11.3 g/dL — ABNORMAL LOW (ref 12.0–15.0)
MCH: 29.3 pg (ref 26.0–34.0)
MCHC: 32 g/dL (ref 30.0–36.0)
MCV: 91.5 fL (ref 80.0–100.0)
Platelets: 198 10*3/uL (ref 150–400)
RBC: 3.86 MIL/uL — ABNORMAL LOW (ref 3.87–5.11)
RDW: 12.7 % (ref 11.5–15.5)
WBC: 9.4 10*3/uL (ref 4.0–10.5)
nRBC: 0 % (ref 0.0–0.2)

## 2021-01-22 LAB — DIFFERENTIAL
Abs Immature Granulocytes: 0.08 10*3/uL — ABNORMAL HIGH (ref 0.00–0.07)
Basophils Absolute: 0 10*3/uL (ref 0.0–0.1)
Basophils Relative: 0 %
Eosinophils Absolute: 0.1 10*3/uL (ref 0.0–0.5)
Eosinophils Relative: 1 %
Immature Granulocytes: 1 %
Lymphocytes Relative: 22 %
Lymphs Abs: 2.1 10*3/uL (ref 0.7–4.0)
Monocytes Absolute: 0.3 10*3/uL (ref 0.1–1.0)
Monocytes Relative: 3 %
Neutro Abs: 6.8 10*3/uL (ref 1.7–7.7)
Neutrophils Relative %: 73 %

## 2021-01-22 LAB — URINALYSIS, ROUTINE W REFLEX MICROSCOPIC
Bilirubin Urine: NEGATIVE
Glucose, UA: NEGATIVE mg/dL
Hgb urine dipstick: NEGATIVE
Ketones, ur: NEGATIVE mg/dL
Nitrite: NEGATIVE
Protein, ur: NEGATIVE mg/dL
Specific Gravity, Urine: 1.014 (ref 1.005–1.030)
pH: 6 (ref 5.0–8.0)

## 2021-01-22 LAB — COMPREHENSIVE METABOLIC PANEL
ALT: 15 U/L (ref 0–44)
AST: 20 U/L (ref 15–41)
Albumin: 3 g/dL — ABNORMAL LOW (ref 3.5–5.0)
Alkaline Phosphatase: 56 U/L (ref 38–126)
Anion gap: 8 (ref 5–15)
BUN: <5 mg/dL — ABNORMAL LOW (ref 6–20)
CO2: 22 mmol/L (ref 22–32)
Calcium: 8.6 mg/dL — ABNORMAL LOW (ref 8.9–10.3)
Chloride: 105 mmol/L (ref 98–111)
Creatinine, Ser: 0.56 mg/dL (ref 0.44–1.00)
GFR, Estimated: >60 mL/min (ref >60)
Glucose, Bld: 78 mg/dL (ref 70–99)
Potassium: 3.7 mmol/L (ref 3.5–5.1)
Sodium: 135 mmol/L (ref 135–145)
Total Bilirubin: 0.7 mg/dL (ref 0.3–1.2)
Total Protein: 6.5 g/dL (ref 6.5–8.1)

## 2021-01-22 LAB — HEMOGLOBIN A1C
Hgb A1c MFr Bld: 4.1 % — ABNORMAL LOW (ref 4.8–5.6)
Mean Plasma Glucose: 70.97 mg/dL

## 2021-01-22 LAB — TYPE AND SCREEN
ABO/RH(D): A POS
Antibody Screen: NEGATIVE

## 2021-01-22 LAB — PROTEIN / CREATININE RATIO, URINE
Creatinine, Urine: 98.93 mg/dL
Protein Creatinine Ratio: 0.08 mg/mg{Cre} (ref 0.00–0.15)
Total Protein, Urine: 8 mg/dL

## 2021-01-22 LAB — RAPID URINE DRUG SCREEN, HOSP PERFORMED
Amphetamines: NOT DETECTED
Barbiturates: NOT DETECTED
Benzodiazepines: NOT DETECTED
Cocaine: NOT DETECTED
Opiates: NOT DETECTED
Tetrahydrocannabinol: POSITIVE — AB

## 2021-01-22 LAB — HIV ANTIBODY (ROUTINE TESTING W REFLEX): HIV Screen 4th Generation wRfx: NONREACTIVE

## 2021-01-22 LAB — HEPATITIS B SURFACE ANTIGEN: Hepatitis B Surface Ag: NONREACTIVE

## 2021-01-22 MED ORDER — ONDANSETRON 4 MG PO TBDP
4.0000 mg | ORAL_TABLET | Freq: Once | ORAL | Status: AC
  Administered 2021-01-22: 4 mg via ORAL
  Filled 2021-01-22: qty 1

## 2021-01-22 MED ORDER — CYCLOBENZAPRINE HCL 5 MG PO TABS
10.0000 mg | ORAL_TABLET | Freq: Once | ORAL | Status: AC
  Administered 2021-01-22: 10 mg via ORAL
  Filled 2021-01-22: qty 2

## 2021-01-22 NOTE — MAU Note (Signed)
Patient came into MAU at [redacted]w[redacted]d gestation with c/o a headache and mild contractions every 6 minutes. Patient denies having LOF or vaginal bleeding. Patient feels fetal movement.

## 2021-01-22 NOTE — MAU Provider Note (Addendum)
History     CSN: [id number]  Arrival date and time: 01/22/21 1827   Event Date/Time   First Provider Initiated Contact with Patient 01/22/21 1937      Chief Complaint  Patient presents with   Contractions   Headache   Suicidal   HPI Nichole Barrett is a 25 y.o. G3P2002 at 7w5dwho presents with a headache and abdominal pain. She reports she started having a headache today that she rates a 10/10. She tried tylenol with no relief. She also reports intermittent contractions. She denies any bleeding or leaking. Reports normal fetal movement. She has been going to MFM for ultrasounds but has had no prenatal care this pregnancy.   She is reporting that she is suicidal. She is tearful and states she has no support at home. She feels like she is better off being dead. She only gets up to take care of her other children.  OB History     Gravida  3   Para  2   Term  2   Preterm      AB      Living  2      SAB      IAB      Ectopic      Multiple      Live Births  2           Past Medical History:  Diagnosis Date   Anemia    Hypertension    Pregnancy induced hypertension     Past Surgical History:  Procedure Laterality Date   NO PAST SURGERIES      Family History  Problem Relation Age of Onset   Cancer Neg Hx    Diabetes Neg Hx    Hypertension Neg Hx     Social History   Tobacco Use   Smoking status: Former    Types: Cigarettes    Quit date: 07/30/2020    Years since quitting: 0.4   Smokeless tobacco: Never  Vaping Use   Vaping Use: Never used  Substance Use Topics   Alcohol use: Never   Drug use: Yes    Types: Marijuana    Allergies: No Known Allergies  Medications Prior to Admission  Medication Sig Dispense Refill Last Dose   Blood Pressure Monitoring (BLOOD PRESSURE KIT) DEVI 1 Device by Does not apply route as needed. 1 each 0    doxylamine, Sleep, (UNISOM) 25 MG tablet Take 1 tablet (25 mg total) by mouth every 8 (eight) hours as  needed. (Patient not taking: Reported on 08/30/2020) 30 tablet 0    promethazine (PHENERGAN) 12.5 MG tablet Take 1 tablet (12.5 mg total) by mouth every 6 (six) hours as needed for nausea or vomiting. (Patient not taking: Reported on 08/30/2020) 30 tablet 0    pyridOXINE (VITAMIN B-6) 25 MG tablet Take 1 tablet (25 mg total) by mouth every 8 (eight) hours. (Patient not taking: Reported on 08/30/2020) 30 tablet 0    Review of Systems  Constitutional: Negative.  Negative for fatigue and fever.  HENT: Negative.    Respiratory: Negative.  Negative for shortness of breath.   Cardiovascular: Negative.  Negative for chest pain.  Gastrointestinal:  Positive for abdominal pain. Negative for constipation, diarrhea, nausea and vomiting.  Genitourinary: Negative.  Negative for dysuria, vaginal bleeding and vaginal discharge.  Neurological:  Positive for headaches. Negative for dizziness.  Psychiatric/Behavioral:  Positive for suicidal ideas.   Physical Exam   Blood pressure (!) 110/53, pulse 79, temperature  98.4 F (36.9 C), temperature source Oral, resp. rate 16, last menstrual period 06/21/2020, SpO2 98 %. Patient Vitals for the past 24 hrs:  BP Temp Temp src Pulse Resp SpO2  01/23/21 0100 (!) 110/53 -- -- 79 -- 98 %  01/23/21 0045 (!) 115/53 -- -- 78 -- 98 %  01/23/21 0030 (!) 102/48 -- -- 70 -- 99 %  01/23/21 0015 (!) 111/39 -- -- 74 -- 99 %  01/23/21 0000 (!) 105/39 -- -- 70 -- 97 %  01/22/21 2345 (!) 92/42 -- -- 73 -- --  01/22/21 2335 -- -- -- -- -- 99 %  01/22/21 2330 (!) 98/44 -- -- 70 -- 98 %  01/22/21 2325 -- -- -- -- -- 98 %  01/22/21 2320 -- -- -- -- -- 97 %  01/22/21 2315 (!) 103/45 -- -- 75 -- 98 %  01/22/21 2310 -- -- -- -- -- 99 %  01/22/21 2305 -- -- -- -- -- 99 %  01/22/21 2300 (!) 109/50 -- -- 81 16 99 %  01/22/21 2255 -- -- -- -- -- 100 %  01/22/21 2250 -- -- -- -- -- 99 %  01/22/21 2245 (!) 95/47 -- -- 72 16 97 %  01/22/21 2230 (!) 109/45 -- -- 71 -- --  01/22/21 2216 (!)  123/41 -- -- 82 -- --  01/22/21 2215 -- -- -- -- -- 99 %  01/22/21 2210 -- -- -- -- -- 98 %  01/22/21 2200 (!) 133/59 -- -- 63 -- 99 %  01/22/21 2146 (!) 135/53 -- -- 74 -- --  01/22/21 2145 -- -- -- -- -- 99 %  01/22/21 2130 139/87 -- -- 81 -- 99 %  01/22/21 2120 -- -- -- -- -- 100 %  01/22/21 2115 138/69 -- -- 87 -- 99 %  01/22/21 2105 -- -- -- -- -- 99 %  01/22/21 2104 (!) 141/77 -- -- 100 -- --  01/22/21 2019 -- -- -- -- -- 99 %  01/22/21 2016 127/68 -- -- 79 -- --  01/22/21 2005 -- -- -- -- -- 99 %  01/22/21 2001 136/67 -- -- 73 -- --  01/22/21 2000 136/67 -- -- 73 -- 99 %  01/22/21 1946 (!) 151/76 -- -- 79 -- --  01/22/21 1945 -- -- -- -- -- 98 %  01/22/21 1930 (!) 141/72 -- -- 91 -- 99 %  01/22/21 1920 -- -- -- -- -- 98 %  01/22/21 1916 138/60 -- -- 77 -- --  01/22/21 1910 138/70 -- -- 80 -- 99 %  01/22/21 1907 (!) 147/72 98.4 F (36.9 C) Oral 89 18 --    Physical Exam Vitals and nursing note reviewed.  Constitutional:      General: She is not in acute distress.    Appearance: She is well-developed.  HENT:     Head: Normocephalic.  Eyes:     Pupils: Pupils are equal, round, and reactive to light.  Cardiovascular:     Rate and Rhythm: Normal rate and regular rhythm.     Heart sounds: Normal heart sounds.  Pulmonary:     Effort: Pulmonary effort is normal. No respiratory distress.     Breath sounds: Normal breath sounds.  Abdominal:     General: Bowel sounds are normal. There is no distension.     Palpations: Abdomen is soft.     Tenderness: There is no abdominal tenderness.  Skin:    General: Skin is warm and dry.  Neurological:  Mental Status: She is alert and oriented to person, place, and time.  Psychiatric:        Mood and Affect: Mood normal.        Behavior: Behavior normal.        Thought Content: Thought content normal.        Judgment: Judgment normal.   Fetal Tracing:  Baseline: 125 Variability: moderate Accels: 15x15 Decels:  none  Toco: none  Cervix closed/thick  MAU Course  Procedures Results for orders placed or performed during the hospital encounter of 01/22/21 (from the past 24 hour(s))  Protein / creatinine ratio, urine     Status: None   Collection Time: 01/22/21  8:27 PM  Result Value Ref Range   Creatinine, Urine 98.93 mg/dL   Total Protein, Urine 8 mg/dL   Protein Creatinine Ratio 0.08 0.00 - 0.15 mg/mg[Cre]  Urinalysis, Routine w reflex microscopic     Status: Abnormal   Collection Time: 01/22/21  8:27 PM  Result Value Ref Range   Color, Urine YELLOW YELLOW   APPearance CLEAR CLEAR   Specific Gravity, Urine 1.014 1.005 - 1.030   pH 6.0 5.0 - 8.0   Glucose, UA NEGATIVE NEGATIVE mg/dL   Hgb urine dipstick NEGATIVE NEGATIVE   Bilirubin Urine NEGATIVE NEGATIVE   Ketones, ur NEGATIVE NEGATIVE mg/dL   Protein, ur NEGATIVE NEGATIVE mg/dL   Nitrite NEGATIVE NEGATIVE   Leukocytes,Ua SMALL (A) NEGATIVE   RBC / HPF 0-5 0 - 5 RBC/hpf   WBC, UA 0-5 0 - 5 WBC/hpf   Bacteria, UA RARE (A) NONE SEEN   Squamous Epithelial / LPF 0-5 0 - 5   Mucus PRESENT   Urine rapid drug screen (hosp performed)     Status: Abnormal   Collection Time: 01/22/21  8:27 PM  Result Value Ref Range   Opiates NONE DETECTED NONE DETECTED   Cocaine NONE DETECTED NONE DETECTED   Benzodiazepines NONE DETECTED NONE DETECTED   Amphetamines NONE DETECTED NONE DETECTED   Tetrahydrocannabinol POSITIVE (A) NONE DETECTED   Barbiturates NONE DETECTED NONE DETECTED  Hepatitis B surface antigen     Status: None   Collection Time: 01/22/21  8:46 PM  Result Value Ref Range   Hepatitis B Surface Ag NON REACTIVE NON REACTIVE  Differential     Status: Abnormal   Collection Time: 01/22/21  8:46 PM  Result Value Ref Range   Neutrophils Relative % 73 %   Neutro Abs 6.8 1.7 - 7.7 K/uL   Lymphocytes Relative 22 %   Lymphs Abs 2.1 0.7 - 4.0 K/uL   Monocytes Relative 3 %   Monocytes Absolute 0.3 0.1 - 1.0 K/uL   Eosinophils Relative 1 %    Eosinophils Absolute 0.1 0.0 - 0.5 K/uL   Basophils Relative 0 %   Basophils Absolute 0.0 0.0 - 0.1 K/uL   Immature Granulocytes 1 %   Abs Immature Granulocytes 0.08 (H) 0.00 - 0.07 K/uL  HIV Antibody (routine testing w rflx)     Status: None   Collection Time: 01/22/21  8:46 PM  Result Value Ref Range   HIV Screen 4th Generation wRfx Non Reactive Non Reactive  Type and screen [place]     Status: None   Collection Time: 01/22/21  8:46 PM  Result Value Ref Range   ABO/RH(D) A POS    Antibody Screen NEG    Sample Expiration      01/25/2021,2359 Performed at [facility], [address],  [place], [place] [address]   CBC     Status: Abnormal   Collection Time: 01/22/21  8:46 PM  Result Value Ref Range   WBC 9.4 4.0 - 10.5 K/uL   RBC 3.86 (L) 3.87 - 5.11 MIL/uL   Hemoglobin 11.3 (L) 12.0 - 15.0 g/dL   HCT 35.3 (L) 36.0 - 46.0 %   MCV 91.5 80.0 - 100.0 fL   MCH 29.3 26.0 - 34.0 pg   MCHC 32.0 30.0 - 36.0 g/dL   RDW 12.7 11.5 - 15.5 %   Platelets 198 150 - 400 K/uL   nRBC 0.0 0.0 - 0.2 %  Comprehensive metabolic panel     Status: Abnormal   Collection Time: 01/22/21  8:46 PM  Result Value Ref Range   Sodium 135 135 - 145 mmol/L   Potassium 3.7 3.5 - 5.1 mmol/L   Chloride 105 98 - 111 mmol/L   CO2 22 22 - 32 mmol/L   Glucose, Bld 78 70 - 99 mg/dL   BUN <5 (L) 6 - 20 mg/dL   Creatinine, Ser 0.56 0.44 - 1.00 mg/dL   Calcium 8.6 (L) 8.9 - 10.3 mg/dL   Total Protein 6.5 6.5 - 8.1 g/dL   Albumin 3.0 (L) 3.5 - 5.0 g/dL   AST 20 15 - 41 U/L   ALT 15 0 - 44 U/L   Alkaline Phosphatase 56 38 - 126 U/L   Total Bilirubin 0.7 0.3 - 1.2 mg/dL   GFR, Estimated >60 >60 mL/min   Anion gap 8 5 - 15  Hemoglobin A1c     Status: Abnormal   Collection Time: 01/22/21  8:46 PM  Result Value Ref Range   Hgb A1c MFr Bld 4.1 (L) 4.8 - 5.6 %   Mean Plasma Glucose 70.97 mg/dL     MDM UA OB Panel Preeclampsia labs Flexeril PO Consult to TTS- suicide precautions  in place  Care turned over to D. Simpson CNM at 2000. Len Blalock CNM 01/22/21  Headache improved with flexeril, now 0/10. Zofran ODT was given for vomiting Cervix closed/thick. Contractions have improved and no longer having pain Pre-e labs wnl, BP's remain normotensive to mild range TTS recommends inpatient admission. I discussed with patient and at this time patient voluntarily agrees for admission. If patient were to try to leave, TTS recommends IVC.  Dr. Roselie Awkward consulted and patient will be remain in MAU until placement has been made. Dr. Roselie Awkward placed routine ante orders  Reassessment at 0715, patient sleeping with sitter at bedside. Care handed over to R. Renato Battles, CNM at [place] [place] 01/23/21 8:04 AM  *Reassessment @ 0830: Patient asleep with sitter at bedside *Drs. Massengill and Akintayo from [facility] in with patient at 18-- see their documentation of their evaluation *TC to Golda Acre, RN @ [phone] for update on bed status and to recommend admission to [facility] for holding *Update from Fredda Hammed, RN @ [place], RN contacted Sharol Harness, RN to find out if patient could be admitted to [facility] - no further updates  Report given to and care assumed by Noni Saupe, FNP @ [address], CNM  01/23/2021 10:33 AM   Resumed care of the patient @ 2030 Patient moved to room 1s20 for a more comfortable bed and private bathroom.  22:00 RN attempted to give report to [facility], RN unable to take report at this time. 2330: Report given to RN on [facility], ok to transfer patient.   Mirai Greenwood, Artist Pais,  NP 01/23/2021 11:30 PM   Assessment and Plan   A:  Depression with suicidal ideation  [redacted] weeks gestation of pregnancy   P:  Admit to [place] placement at [facility] or [facility] to continue to see patient.   Lezlie Lye, NP 01/23/2021 11:31 PM
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
## 2021-01-23 DIAGNOSIS — R45851 Suicidal ideations: Secondary | ICD-10-CM

## 2021-01-23 DIAGNOSIS — F4310 Post-traumatic stress disorder, unspecified: Secondary | ICD-10-CM

## 2021-01-23 DIAGNOSIS — F32A Depression, unspecified: Secondary | ICD-10-CM | POA: Insufficient documentation

## 2021-01-23 DIAGNOSIS — F322 Major depressive disorder, single episode, severe without psychotic features: Secondary | ICD-10-CM

## 2021-01-23 LAB — RPR: RPR Ser Ql: NONREACTIVE

## 2021-01-23 MED ORDER — SERTRALINE HCL 50 MG PO TABS
25.0000 mg | ORAL_TABLET | Freq: Every day | ORAL | Status: DC
Start: 2021-01-24 — End: 2021-01-24
  Administered 2021-01-24: 25 mg via ORAL
  Filled 2021-01-23: qty 1

## 2021-01-23 MED ORDER — ONDANSETRON 4 MG PO TBDP
4.0000 mg | ORAL_TABLET | Freq: Once | ORAL | Status: AC
  Administered 2021-01-23: 4 mg via ORAL
  Filled 2021-01-23: qty 1

## 2021-01-23 MED ORDER — ZOLPIDEM TARTRATE 5 MG PO TABS: 5.0000 mg | ORAL_TABLET | Freq: Every evening | ORAL | Status: DC | PRN

## 2021-01-23 MED ORDER — CALCIUM CARBONATE ANTACID 500 MG PO CHEW
2.0000 | CHEWABLE_TABLET | ORAL | Status: DC | PRN
  Administered 2021-01-24 – 2021-01-25 (×2): 400 mg via ORAL
  Filled 2021-01-23 (×2): qty 2

## 2021-01-23 MED ORDER — DOCUSATE SODIUM 100 MG PO CAPS
100.0000 mg | ORAL_CAPSULE | Freq: Every day | ORAL | Status: DC
  Administered 2021-01-23 – 2021-01-26 (×4): 100 mg via ORAL
  Filled 2021-01-23 (×4): qty 1

## 2021-01-23 MED ORDER — ACETAMINOPHEN 325 MG PO TABS
650.0000 mg | ORAL_TABLET | ORAL | Status: DC | PRN
  Administered 2021-01-23 – 2021-01-24 (×4): 650 mg via ORAL
  Filled 2021-01-23 (×4): qty 2

## 2021-01-23 MED ORDER — PRENATAL MULTIVITAMIN CH
1.0000 | ORAL_TABLET | Freq: Every day | ORAL | Status: DC
  Administered 2021-01-23 – 2021-01-25 (×3): 1 via ORAL
  Filled 2021-01-23 (×3): qty 1

## 2021-01-23 NOTE — MAU Note (Signed)
In to see pt-pt teary and crying. Sat at bedside.support offered. Pt stated she didn't want to talk. Encouraged pt to call if she decides she needs to share.

## 2021-01-23 NOTE — MAU Note (Signed)
Pt more animated with conversation. Talking with sitter and watching tv. PM snack provided.

## 2021-01-23 NOTE — Progress Notes (Signed)
Per chart review, "per Teodora Medici, patient meets criteria for inpatient treatment. There are no available or appropriate beds at [facility] today. CSW faxed referrals for placement".   Per Crissie Reese, MSW, LCSW-A, LCAS-A, patient will be reassess within 24 hrs of assessment. Therefore, that will determine if patient still meets inpatient criteria or is appropriate to discharge.   Crissie Reese, MSW, Hilda Lias, [phone]  Additional TTS numbers if needed: [phone]  Dolores Frame, MSW, LCSW-A Clinical Social Worker- Weekends [phone]

## 2021-01-23 NOTE — MAU Note (Signed)
[facility] spoke with CNM and attending regarding pt not having medical need to be admitted to [facility]. Pt to be held in MAU until placement can be arranged

## 2021-01-23 NOTE — MAU Note (Signed)
Pt medicated with Tylenol for back pain.  FHR auscultated. Pt mood improved with auscultation and talking about her baby. States his name is Nichole Barrett. Reports + fetal movement. Denies contractions, leaking of fluid or vaginal discharge.

## 2021-01-23 NOTE — MAU Note (Signed)
Pt return from shower in [facility] 118. Moved to MAU room 20

## 2021-01-23 NOTE — Progress Notes (Addendum)
Dr. Manya Silvas and Dr. Victorino Dike in attendance at bedside. Placement pending at this time. Provider and house ac aware.

## 2021-01-23 NOTE — Progress Notes (Signed)
Per Teodora Medici, patient meets criteria for inpatient treatment. There are no available or appropriate beds at [facility] today. CSW faxed referrals to the following facilities for review:  [facility]    TTS will continue to seek bed placement.   Crissie Reese, MSW, LCSW-A, LCAS-A Phone: [phone] Disposition/TOC

## 2021-01-23 NOTE — Consult Note (Addendum)
[facility] Face-to-Face Psychiatry Consult   Reason for Consult:  ''SI, pregnancy.'' Referring Physician:  Scheryl Darter, MD Patient Identification: Nichole Barrett MRN:  [id number] Principal Diagnosis: Major depressive disorder, single episode, severe (HCC) Diagnosis:  Principal Problem:   Major depressive disorder, single episode, severe (HCC) Active Problems:   Depression with suicidal ideation   Posttraumatic stress disorder   Total Time spent with patient: 1 hour  Subjective:   Nichole Barrett is a 25 y.o. female patient admitted with suicidal thoughts .  HPI:   25 year old female with history of  MDD,PTSD, Substance use disorder, previous suicide attempts several months ago, previous self-harm at age 61 who is [redacted]w[redacted]d pregnant. Patient reports worsening depression and recurrent suicidal thoughts in the last 2 weeks. Current stressor include financial problem, taking care of her children without much help, unemployment, family  and marital conflict. Patient reports hopelessness, feeling worthless, night mares, social withdrawal/isolation, anxiety, worries and apprehensions about her future. Considering these factors, the overall suicide risk at this point appears to be high. As a result patient will benefit from inpatient psychiatric admission when she is medically stable.    Past Psychiatric History: as above  Risk to Self:  yes Risk to Others:  denies Prior Inpatient Therapy:  none Prior Outpatient Therapy:  yes, at age 70  Past Medical History:  Past Medical History:  Diagnosis Date   Anemia    Hypertension    Pregnancy induced hypertension     Past Surgical History:  Procedure Laterality Date   NO PAST SURGERIES     Family History:  Family History  Problem Relation Age of Onset   Cancer Neg Hx    Diabetes Neg Hx    Hypertension Neg Hx    Family Psychiatric  History:  Social History:  Social History   Substance and Sexual Activity  Alcohol Use Never     Social History    Substance and Sexual Activity  Drug Use Yes   Types: Marijuana    Social History   Socioeconomic History   Marital status: Single    Spouse name: Not on file   Number of children: Not on file   Years of education: Not on file   Highest education level: Not on file  Occupational History   Not on file  Tobacco Use   Smoking status: Former    Types: Cigarettes    Quit date: 07/30/2020    Years since quitting: 0.4   Smokeless tobacco: Never  Vaping Use   Vaping Use: Never used  Substance and Sexual Activity   Alcohol use: Never   Drug use: Yes    Types: Marijuana   Sexual activity: Yes    Birth control/protection: None  Other Topics Concern   Not on file  Social History Narrative   Not on file   Social Determinants of Health   Financial Resource Strain: Not on file  Food Insecurity: Food Insecurity Present   Worried About Running Out of Food in the Last Year: Sometimes true   Ran Out of Food in the Last Year: Sometimes true  Transportation Needs: Unmet Transportation Needs   Lack of Transportation (Medical): Yes   Lack of Transportation (Non-Medical): Yes  Physical Activity: Not on file  Stress: Not on file  Social Connections: Not on file   Additional Social History:    Allergies:  No Known Allergies  Labs:  Results for orders placed or performed during the hospital encounter of 01/22/21 (from the past 48  hour(s))  Protein / creatinine ratio, urine     Status: None   Collection Time: 01/22/21  8:27 PM  Result Value Ref Range   Creatinine, Urine 98.93 mg/dL   Total Protein, Urine 8 mg/dL    Comment: NO NORMAL RANGE ESTABLISHED FOR THIS TEST   Protein Creatinine Ratio 0.08 0.00 - 0.15 mg/mg[Cre]    Comment: Performed at [facility], [address], [place], [place] [address]  Urinalysis, Routine w reflex microscopic     Status: Abnormal   Collection Time: 01/22/21  8:27 PM  Result Value Ref Range   Color, Urine YELLOW YELLOW   APPearance CLEAR  CLEAR   Specific Gravity, Urine 1.014 1.005 - 1.030   pH 6.0 5.0 - 8.0   Glucose, UA NEGATIVE NEGATIVE mg/dL   Hgb urine dipstick NEGATIVE NEGATIVE   Bilirubin Urine NEGATIVE NEGATIVE   Ketones, ur NEGATIVE NEGATIVE mg/dL   Protein, ur NEGATIVE NEGATIVE mg/dL   Nitrite NEGATIVE NEGATIVE   Leukocytes,Ua SMALL (A) NEGATIVE   RBC / HPF 0-5 0 - 5 RBC/hpf   WBC, UA 0-5 0 - 5 WBC/hpf   Bacteria, UA RARE (A) NONE SEEN   Squamous Epithelial / LPF 0-5 0 - 5   Mucus PRESENT     Comment: Performed at [facility], [address], [place], [place] [address]  Urine rapid drug screen (hosp performed)     Status: Abnormal   Collection Time: 01/22/21  8:27 PM  Result Value Ref Range   Opiates NONE DETECTED NONE DETECTED   Cocaine NONE DETECTED NONE DETECTED   Benzodiazepines NONE DETECTED NONE DETECTED   Amphetamines NONE DETECTED NONE DETECTED   Tetrahydrocannabinol POSITIVE (A) NONE DETECTED   Barbiturates NONE DETECTED NONE DETECTED    Comment: (NOTE) DRUG SCREEN FOR MEDICAL PURPOSES ONLY.  IF CONFIRMATION IS NEEDED FOR ANY PURPOSE, NOTIFY LAB WITHIN 5 DAYS.  LOWEST DETECTABLE LIMITS FOR URINE DRUG SCREEN Drug Class                     Cutoff (ng/mL) Amphetamine and metabolites    1000 Barbiturate and metabolites    200 Benzodiazepine                 200 Tricyclics and metabolites     300 Opiates and metabolites        300 Cocaine and metabolites        300 THC                            50 Performed at [facility], [address], [place], [place] [address]   Hepatitis B surface antigen     Status: None   Collection Time: 01/22/21  8:46 PM  Result Value Ref Range   Hepatitis B Surface Ag NON REACTIVE NON REACTIVE    Comment: Performed at [facility], [address] [place], [place] [address]  Differential     Status: Abnormal   Collection Time: 01/22/21  8:46 PM  Result Value Ref Range   Neutrophils Relative % 73 %   Neutro Abs 6.8 1.7 - 7.7 K/uL    Lymphocytes Relative 22 %   Lymphs Abs 2.1 0.7 - 4.0 K/uL   Monocytes Relative 3 %   Monocytes Absolute 0.3 0.1 - 1.0 K/uL   Eosinophils Relative 1 %   Eosinophils Absolute 0.1 0.0 - 0.5 K/uL   Basophils  Relative 0 %   Basophils Absolute 0.0 0.0 - 0.1 K/uL   Immature Granulocytes 1 %   Abs Immature Granulocytes 0.08 (H) 0.00 - 0.07 K/uL    Comment: Performed at [facility], [address], [place], [place] [address]  HIV Antibody (routine testing w rflx)     Status: None   Collection Time: 01/22/21  8:46 PM  Result Value Ref Range   HIV Screen 4th Generation wRfx Non Reactive Non Reactive    Comment: Performed at [facility], [address], [place], [place] [address]  Type and screen [facility]     Status: None   Collection Time: 01/22/21  8:46 PM  Result Value Ref Range   ABO/RH(D) A POS    Antibody Screen NEG    Sample Expiration      01/25/2021,2359 Performed at [facility] Lab, [address] [place], [place] [address]   CBC     Status: Abnormal   Collection Time: 01/22/21  8:46 PM  Result Value Ref Range   WBC 9.4 4.0 - 10.5 K/uL   RBC 3.86 (L) 3.87 - 5.11 MIL/uL   Hemoglobin 11.3 (L) 12.0 - 15.0 g/dL   HCT 11.9 (L) 41.7 - 40.8 %   MCV 91.5 80.0 - 100.0 fL   MCH 29.3 26.0 - 34.0 pg   MCHC 32.0 30.0 - 36.0 g/dL   RDW 14.4 81.8 - 56.3 %   Platelets 198 150 - 400 K/uL   nRBC 0.0 0.0 - 0.2 %    Comment: Performed at [facility], [address], [place], [place] [address]  Comprehensive metabolic panel     Status: Abnormal   Collection Time: 01/22/21  8:46 PM  Result Value Ref Range   Sodium 135 135 - 145 mmol/L   Potassium 3.7 3.5 - 5.1 mmol/L   Chloride 105 98 - 111 mmol/L   CO2 22 22 - 32 mmol/L   Glucose, Bld 78 70 - 99 mg/dL    Comment: Glucose reference range applies only to samples taken after fasting for at least 8 hours.   BUN <5 (L) 6 - 20 mg/dL   Creatinine, Ser 2.63 0.44 - 1.00 mg/dL   Calcium 8.6 (L) 8.9 - 10.3  mg/dL   Total Protein 6.5 6.5 - 8.1 g/dL   Albumin 3.0 (L) 3.5 - 5.0 g/dL   AST 20 15 - 41 U/L   ALT 15 0 - 44 U/L   Alkaline Phosphatase 56 38 - 126 U/L   Total Bilirubin 0.7 0.3 - 1.2 mg/dL   GFR, Estimated >78 >58 mL/min    Comment: (NOTE) Calculated using the CKD-EPI Creatinine Equation (2021)    Anion gap 8 5 - 15    Comment: Performed at [facility], [address], [place], [place] [address]  Hemoglobin A1c     Status: Abnormal   Collection Time: 01/22/21  8:46 PM  Result Value Ref Range   Hgb A1c MFr Bld 4.1 (L) 4.8 - 5.6 %    Comment: (NOTE) Pre diabetes:          5.7%-6.4%  Diabetes:              >6.4%  Glycemic control for   <7.0% adults with diabetes    Mean Plasma Glucose 70.97 mg/dL    Comment: Performed at [facility], [address], [place], [place] [address]    Current Facility-Administered Medications  Medication Dose Route Frequency Provider Last Rate Last Admin   acetaminophen (TYLENOL) tablet 650 mg  650 mg Oral Q4H PRN Adam Phenix, MD   650 mg at 01/23/21 1056   calcium carbonate (TUMS - dosed in mg elemental calcium) chewable tablet 400 mg of elemental calcium  2 tablet Oral Q4H PRN Adam Phenix, MD       docusate sodium (COLACE) capsule 100 mg  100 mg Oral Daily Adam Phenix, MD       prenatal multivitamin tablet 1 tablet  1 tablet Oral Q1200 Adam Phenix, MD       zolpidem Remus Loffler) tablet 5 mg  5 mg Oral QHS PRN Adam Phenix, MD        Musculoskeletal: Strength & Muscle Tone: within normal limits Gait & Station: normal Patient leans: N/A   Psychiatric Specialty Exam:  Presentation  General Appearance: Appropriate for Environment  Eye Contact:Good  Speech:Clear and Coherent; Slow  Speech Volume:Decreased  Handedness:Right   Mood and Affect  Mood:Depressed  Affect:Constricted   Thought Process  Thought Processes:Coherent; Linear  Descriptions of Associations:Intact  Orientation:Full (Time, Place  and Person)  Thought Content:Logical  History of Schizophrenia/Schizoaffective disorder:No  Duration of Psychotic Symptoms:No data recorded Hallucinations:Hallucinations: None  Ideas of Reference:None  Suicidal Thoughts:Suicidal Thoughts: Yes, Active SI Active Intent and/or Plan: With Intent; Without Plan  Homicidal Thoughts:Homicidal Thoughts: No   Sensorium  Memory:Immediate Good; Recent Good; Remote Good  Judgment:Intact  Insight:Poor; Shallow   Executive Functions  Concentration:Fair  Attention Span:Fair  Recall:Good  Fund of Knowledge:Good  Language:Good   Psychomotor Activity  Psychomotor Activity:Psychomotor Activity: Psychomotor Retardation; Decreased   Assets  Assets:Communication Skills; Desire for Improvement   Sleep  Sleep:Sleep: Fair   Physical Exam: Physical Exam Review of Systems  Psychiatric/Behavioral:  Positive for depression, substance abuse and suicidal ideas.   Blood pressure (!) 109/47, pulse 90, temperature 98.4 F (36.9 C), temperature source Oral, resp. rate 18, last menstrual period 06/21/2020, SpO2 99 %. There is no height or weight on file to calculate BMI.  Treatment Plan Summary: 25 year old female with history of depression, PTSD who was admitted due to worsening depression and suicidal thoughts. Patient reports occasional visual hallucination, denies delusions, mood swings and unable to contract for safety at this time.  Recommendations: -Needs 1:1 sitter for safety -Will start patient on Sertraline 25 mg daily for depression/PTSD -Consider social worker consult to facilitate inpatient psychiatric placement/admission. -Consider IVC if patient refused Voluntary admission  Disposition: Recommend psychiatric Inpatient admission when medically cleared. Supportive therapy provided about ongoing stressors. Psychiatric service will follow  Thedore Mins, MD 01/23/2021 10:58 AM

## 2021-01-23 NOTE — Progress Notes (Signed)
Per Teodora Medici, patient meets criteria for inpatient treatment. There are no available or appropriate beds at [facility] today. CSW faxed referrals to the following facilities for review:  [facility]  Pending - No Request Sent N/A [address], [place] [address] [phone] --  [facility]  Pending - No Request Sent N/A [address], [place] [address] [phone] --  [facility]  Pending - No Request Sent N/A [address] [place] [address] [phone] --  [facility]  Pending - No Request Sent N/A [address] [place] [address] [phone] --  [facility]  Pending - No Request Sent N/A [address] [place] [address] [phone] --  [facility]  Pending - No Request Sent N/A [address] [place] [address] [phone] --  [facility]  Pending - No Request Sent N/A [address] [place], [place] [address] [phone] --  [facility]  Pending - No Request Sent N/A [address] [place] [address] [phone] --  [facility]  Pending - No Request Sent N/A [address] [place] [address] [phone] --  [facility]  Pending - No Request Sent N/A [address] [place] [address] [phone] --  [facility]  Pending - No Request Sent N/A [address] [place] [address] [phone] --  [facility]  Pending - No Request Sent N/A [address] [place] [address] [phone] --  [facility]  Pending - No Request Sent N/A [address] [place] [address] [phone] --   [facility]  Pending - No Request Sent N/A [address], [place] [address] [phone] --  [facility]  Pending - No Request Sent N/A [address], [place] [address] [phone] --  [facility]  Pending - No Request Sent N/A [address] [place] [address] [phone] --    TTS will continue to seek bed placement.  Crissie Reese, MSW, LCSW-A, LCAS-A Phone: [phone] Disposition/TOC

## 2021-01-23 NOTE — MAU Note (Signed)
Sandwich tray and ginger ale provided per pt request.

## 2021-01-23 NOTE — Progress Notes (Signed)
Report received from night RN. Patient asleep. MAU tech at bedside.

## 2021-01-23 NOTE — Progress Notes (Signed)
Report to [employer], charge [employer].  Pt to be admitted over there.  Charge will call back with room assignment.

## 2021-01-24 DIAGNOSIS — Z3483 Encounter for supervision of other normal pregnancy, third trimester: Secondary | ICD-10-CM

## 2021-01-24 DIAGNOSIS — Z20822 Contact with and (suspected) exposure to covid-19: Secondary | ICD-10-CM | POA: Diagnosis not present

## 2021-01-24 DIAGNOSIS — F323 Major depressive disorder, single episode, severe with psychotic features: Secondary | ICD-10-CM | POA: Diagnosis not present

## 2021-01-24 DIAGNOSIS — F322 Major depressive disorder, single episode, severe without psychotic features: Secondary | ICD-10-CM | POA: Diagnosis not present

## 2021-01-24 DIAGNOSIS — Z3A31 31 weeks gestation of pregnancy: Secondary | ICD-10-CM | POA: Diagnosis not present

## 2021-01-24 DIAGNOSIS — O99343 Other mental disorders complicating pregnancy, third trimester: Secondary | ICD-10-CM | POA: Diagnosis not present

## 2021-01-24 LAB — RESP PANEL BY RT-PCR (FLU A&B, COVID) ARPGX2
Influenza A by PCR: NEGATIVE
Influenza B by PCR: NEGATIVE
SARS Coronavirus 2 by RT PCR: NEGATIVE

## 2021-01-24 LAB — RUBELLA SCREEN: Rubella: <0.9 index — ABNORMAL LOW (ref >0.99)

## 2021-01-24 MED ORDER — VITAMIN B-6 25 MG PO TABS
25.0000 mg | ORAL_TABLET | Freq: Two times a day (BID) | ORAL | Status: DC
  Administered 2021-01-24 – 2021-01-25 (×2): 25 mg via ORAL
  Filled 2021-01-24 (×4): qty 1

## 2021-01-24 MED ORDER — SERTRALINE HCL 50 MG PO TABS
50.0000 mg | ORAL_TABLET | Freq: Every day | ORAL | Status: DC
Start: 2021-01-25 — End: 2021-01-25
  Administered 2021-01-25: 50 mg via ORAL
  Filled 2021-01-24: qty 1

## 2021-01-24 MED ORDER — PROMETHAZINE HCL 25 MG PO TABS
12.5000 mg | ORAL_TABLET | ORAL | Status: DC | PRN
  Administered 2021-01-24 – 2021-01-26 (×7): 25 mg via ORAL
  Filled 2021-01-24 (×7): qty 1

## 2021-01-24 MED ORDER — PROMETHAZINE HCL 12.5 MG RE SUPP
12.5000 mg | RECTAL | Status: DC | PRN
  Filled 2021-01-24: qty 2

## 2021-01-24 MED ORDER — DOXYLAMINE SUCCINATE (SLEEP) 25 MG PO TABS
25.0000 mg | ORAL_TABLET | Freq: Two times a day (BID) | ORAL | Status: DC
  Administered 2021-01-24 – 2021-01-25 (×2): 25 mg via ORAL
  Filled 2021-01-24 (×4): qty 1

## 2021-01-24 NOTE — Progress Notes (Addendum)
Follow-Up with Inpatient Behavioral Health Placement   @5 :06pm CSW spoke with the Logistics Intake worker within the Perinatal Psychiatry Inpatient Unit [facility]) who advised CSW that there current unit is full, however there is upcoming discharges. CSW was advised that intake would fax CSW the intake form to complete and advised CSW to send referral for review.  @6 :01pm CSW received fax from [facility] with referral. CSW will complete referral form and fax back.  @6 :37pm CSW faxed referral to [facility]  [phone]. CSW will request that first shift follow up in the morning.  CSW is still seek appropriate placement for pt and will continue to assist and follow pt.    , MSW, [facility] 01/24/2021 6:32 PM

## 2021-01-24 NOTE — H&P (Signed)
OBSTETRIC ADMISSION HISTORY AND PHYSICAL  Nichole Barrett is a 25 y.o. female G3P2002 with IUP at 16w0dby LMP admitted due to suicidal concerns.  Per behavioral health, pt has met criteria for inpatient management.  Please see psych consult for further information regarding this matter.  She denies vaginal bleeding, LOF, or painful contractions.  +FM  Past Medical History: Past Medical History:  Diagnosis Date   Anemia        Pregnancy induced hypertension     Past Surgical History: Past Surgical History:  Procedure Laterality Date   NO PAST SURGERIES      Obstetrical History: OB History     Gravida  3   Para  2   Term  2   Preterm      AB      Living  2      SAB      IAB      Ectopic      Multiple      Live Births  2         24098 FTNSVD, complicated by HTN 21191 FTNSVD, uncomplicated   Social History Social History   Socioeconomic History   Marital status: Single    Spouse name: Not on file   Number of children: Not on file   Years of education: Not on file   Highest education level: Not on file  Occupational History   Not on file  Tobacco Use   Smoking status: Former    Types: Cigarettes    Quit date: 07/30/2020    Years since quitting: 0.4   Smokeless tobacco: Never  Vaping Use   Vaping Use: Never used  Substance and Sexual Activity   Alcohol use: Never   Drug use: Yes    Types: Marijuana   Sexual activity: Yes    Birth control/protection: None  Other Topics Concern   Not on file  Social History Narrative   Not on file   Family History: Family History  Problem Relation Age of Onset   Cancer Neg Hx    Diabetes Neg Hx    Hypertension Neg Hx     Allergies: No Known Allergies  Medications Prior to Admission  Medication Sig Dispense Refill Last Dose   Blood Pressure Monitoring (BLOOD PRESSURE KIT) DEVI 1 Device by Does not apply route as needed. 1 each 0    doxylamine, Sleep, (UNISOM) 25 MG tablet Take 1 tablet (25 mg  total) by mouth every 8 (eight) hours as needed. (Patient not taking: Reported on 08/30/2020) 30 tablet 0    promethazine (PHENERGAN) 12.5 MG tablet Take 1 tablet (12.5 mg total) by mouth every 6 (six) hours as needed for nausea or vomiting. (Patient not taking: Reported on 08/30/2020) 30 tablet 0    pyridOXINE (VITAMIN B-6) 25 MG tablet Take 1 tablet (25 mg total) by mouth every 8 (eight) hours. (Patient not taking: Reported on 08/30/2020) 30 tablet 0      Review of Systems   All systems reviewed and negative except as stated in HPI  Blood pressure (!) 130/58, pulse 87, temperature 97.7 F (36.5 C), temperature source Oral, resp. rate 16, height '5\' 7"'  (1.702 m), weight 109.9 kg, last menstrual period 06/21/2020, SpO2 100 %. General appearance: alert, cooperative, and no distress Lungs: clear to auscultation bilaterally Heart: regular rate and rhythm Abdomen: gravid, soft, non-tender; bowel sounds normal Pelvic: deferred Extremities: Homans sign is negative, no sign of DVT Presentation: cephalic Fetal monitoring 135 by doppler  Dilation: Closed Effacement (%): Thick Exam by:: Maryagnes Amos CNM   Prenatal labs: ABO, Rh: --/--/A POS (10/29 2046) Antibody: NEG (10/29 2046) Rubella:   RPR: NON REACTIVE (10/29 2046)  HBsAg: NON REACTIVE (10/29 2046)  HIV: Non Reactive (10/29 2046)  GBS:    1 hr Glucola not completed Genetic screening  not completed Anatomy US normal anatomy  Prenatal Transfer Tool  Maternal Diabetes: No Genetic Screening:  not completed Maternal Ultrasounds/Referrals: Normal Fetal Ultrasounds or other Referrals:  None Maternal Substance Abuse:  Yes:  Type: Marijuana Significant Maternal Medications:  None Significant Maternal Lab Results: None  Results for orders placed or performed during the hospital encounter of 01/22/21 (from the past 24 hour(s))  Resp Panel by RT-PCR (Flu A&B, Covid) Nasopharyngeal Swab   Collection Time: 01/24/21  2:33 AM   Specimen:  Nasopharyngeal Swab; Nasopharyngeal(NP) swabs in vial transport medium  Result Value Ref Range   SARS Coronavirus 2 by RT PCR NEGATIVE NEGATIVE   Influenza A by PCR NEGATIVE NEGATIVE   Influenza B by PCR NEGATIVE NEGATIVE    Patient Active Problem List   Diagnosis Date Noted   Depression with suicidal ideation 01/23/2021   Posttraumatic stress disorder 01/23/2021   Major depressive disorder, single episode, severe ([place]) 01/23/2021   Ventricular septal defect (VSD) of fetus affecting management of pregnancy 11/01/2020   Supervision of high risk pregnancy, antepartum 08/30/2020   History of pregnancy induced hypertension 08/30/2020   Anemia    Depression    Intrauterine pregnancy 07/28/2020   Substance use 07/28/2020    Assessment/Plan:  Nichole Barrett is a 25 y.o. G3P2002 at 3w0dhere for suicidal risk factors -Management per Psych team -Fetal well being- reassuring, continue daily dopplers -Maternal care  PNV daily  Diclegis daily, Phenergan prn  Tylenol prn  May ambulate  Regular diet JJanyth Pupa DO Attending [place]obinson [place]for [place] [place]

## 2021-01-24 NOTE — Progress Notes (Signed)
FACULTY PRACTICE ANTEPARTUM(COMPREHENSIVE) NOTE  Nichole Barrett is a 25 y.o. [id number] with Estimated Date of Delivery: 03/28/21   By  LMP [redacted]w[redacted]d  who is admitted for inpatient psychiatric care.    Fetal presentation is cephalic. Length of Stay:  1  Days  Date of admission:01/22/2021  Subjective: Pt resting comfortably in bed.  Reports no acute complaints from an OB standpoint. Patient reports the fetal movement as active. Patient reports uterine contraction  activity as none. Patient reports  vaginal bleeding as none. Patient describes fluid per vagina as None.  Vitals:  Blood pressure (!) 120/55, pulse 87, temperature 98.1 F (36.7 C), temperature source Oral, resp. rate 16, height 5\' 7"  (1.702 m), weight 109.9 kg, last menstrual period 06/21/2020, SpO2 100 %. Vitals:   01/24/21 0243 01/24/21 0611 01/24/21 0700 01/24/21 0813  BP:  (!) 116/47 (!) 120/55 (!) 120/55  Pulse:  91 87 87  Resp:  16    Temp:  97.8 F (36.6 C) 98.1 F (36.7 C) 98.1 F (36.7 C)  TempSrc:  Oral Oral Oral  SpO2:      Weight: 109.9 kg     Height:       Physical Examination:  General appearance - alert, well appearing, and in no distress Mental status - normal mood, behavior, speech, dress, motor activity, and thought processes Chest - clear to auscultation, no wheezes, rales or rhonchi, symmetric air entry Heart - normal rate and regular rhythm Abdomen - gravid, soft and non-tender Extremities - no edema, no calf tenderness Skin - warm and dry FHT: 135 by doppler  Labs:  Results for orders placed or performed during the hospital encounter of 01/22/21 (from the past 24 hour(s))  Resp Panel by RT-PCR (Flu A&B, Covid) Nasopharyngeal Swab   Collection Time: 01/24/21  2:33 AM   Specimen: Nasopharyngeal Swab; Nasopharyngeal(NP) swabs in vial transport medium  Result Value Ref Range   SARS Coronavirus 2 by RT PCR NEGATIVE NEGATIVE   Influenza A by PCR NEGATIVE NEGATIVE   Influenza B by PCR NEGATIVE NEGATIVE     Medications:  Scheduled  docusate sodium  100 mg Oral Daily   prenatal multivitamin  1 tablet Oral Q1200   sertraline  25 mg Oral Daily   I have reviewed the patient's current medications.  ASSESSMENT: 01/26/21 [redacted]w[redacted]d Estimated Date of Delivery: 03/28/21  Patient Active Problem List   Diagnosis Date Noted   Depression with suicidal ideation 01/23/2021   Posttraumatic stress disorder 01/23/2021   Major depressive disorder, single episode, severe ([facility]) 01/23/2021   Ventricular septal defect (VSD) of fetus affecting management of pregnancy 11/01/2020   Supervision of high risk pregnancy, antepartum 08/30/2020   History of pregnancy induced hypertension 08/30/2020   Anemia    Depression    Intrauterine pregnancy 07/28/2020   Substance use 07/28/2020    PLAN: -from an OB standpoint, pt remains stable -continue doppler daily -Sertraline management per psych- will likely increase to standard dose as pt tolerates  09/27/2020 Mitali Shenefield 01/24/2021,10:27 AM

## 2021-01-24 NOTE — Progress Notes (Signed)
Patient has been denied by [facility] due to pregnancy. Patient meets BH inpatient criteria per the recommendation of Cecilio Asper, NP. Patient has been faxed out to the following facilities:   [facility], [place], [place], [place], and [facility]    Damita Dunnings, MSW, LCSW-A  11:18 AM 01/24/2021

## 2021-01-24 NOTE — Plan of Care (Signed)
  Problem: Education: Goal: Ability to make informed decisions regarding treatment will improve Outcome: Progressing   Problem: Education: Goal: Knowledge of disease or condition will improve Outcome: Completed/Met   Problem: Coping: Goal: Coping ability will improve Outcome: Completed/Met
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
## 2021-01-25 DIAGNOSIS — O99343 Other mental disorders complicating pregnancy, third trimester: Secondary | ICD-10-CM

## 2021-01-25 DIAGNOSIS — F322 Major depressive disorder, single episode, severe without psychotic features: Secondary | ICD-10-CM | POA: Diagnosis not present

## 2021-01-25 DIAGNOSIS — Z3A31 31 weeks gestation of pregnancy: Secondary | ICD-10-CM

## 2021-01-25 LAB — GLUCOSE, RANDOM: Glucose, Bld: 123 mg/dL — ABNORMAL HIGH (ref 70–99)

## 2021-01-25 LAB — GLUCOSE, FASTING GESTATIONAL: Glucose Tolerance, Fasting: 82 mg/dL

## 2021-01-25 MED ORDER — BISACODYL 5 MG PO TBEC
5.0000 mg | DELAYED_RELEASE_TABLET | Freq: Every day | ORAL | Status: DC | PRN
  Administered 2021-01-25: 5 mg via ORAL
  Filled 2021-01-25: qty 1

## 2021-01-25 MED ORDER — COMPLETENATE 29-1 MG PO CHEW: 1.0000 | CHEWABLE_TABLET | Freq: Every day | ORAL | Status: DC

## 2021-01-25 MED ORDER — SERTRALINE HCL 50 MG PO TABS
75.0000 mg | ORAL_TABLET | Freq: Every day | ORAL | Status: DC
  Administered 2021-01-26: 75 mg via ORAL
  Filled 2021-01-25: qty 2
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
## 2021-01-25 NOTE — Progress Notes (Signed)
IVC forms faxed to [facility] unit.

## 2021-01-25 NOTE — Progress Notes (Signed)
Patient has been denied by [facility] and [facility] due to pregnancy. Patient meets BH inpatient criteria per the recommendation of Cecilio Asper, NP. Patient has been faxed out to the following facilities:   [facility]  [address] [place] [address] [phone]  [facility]  [address] [place] [address] [phone]  [facility]  [address] [place] [address] [phone]  [facility]  [address], [place] [address] [phone]  [facility]  [address] [place] [address] [phone]  [facility]  [address] [place], [place] [address] [phone]  [facility]  [address] [place] [address] [phone]  [facility]  [address] [place] [address] [phone]  [facility]  [address], [place] [address] [phone]  [facility]  [address], [place] [address] [phone]  [facility]  [address], [place] [address] [phone]  [facility]  [address], [place] [address] [phone]  [facility]  [address], [place] [address] [phone]  [facility]  [address] [place] [address] [phone]  [facility]  [address] [place], [place] [address] [phone]  [facility]  [address], [place] [address] [phone]  [facility]  [address], [place] [address] [phone]   [facility]  [address] [place] [address] [phone]  [facility]  [address] [place] [address] [phone]  [facility]  [address] [place] [address] [phone]    Damita Dunnings, MSW, LCSW-A  9:29 AM 01/25/2021

## 2021-01-25 NOTE — Progress Notes (Addendum)
FACULTY PRACTICE ANTEPARTUM(COMPREHENSIVE) NOTE  Nichole Barrett is a 25 y.o. [id number] with Estimated Date of Delivery: 03/28/21   By  LMP [redacted]w[redacted]d  who is admitted for inpatient psychiatric care.    Fetal presentation is cephalic. Length of Stay:  2  Days  Date of admission:01/22/2021  Subjective: Pt resting comfortably in bed.  Reports no acute complaints from an OB standpoint. Patient reports the fetal movement as active. Patient reports uterine contraction  activity as none. Patient reports  vaginal bleeding as none. Patient describes fluid per vagina as None.  Vitals:  Blood pressure 124/61, pulse 83, temperature 98.1 F (36.7 C), temperature source Oral, resp. rate 18, height 5\' 7"  (1.702 m), weight 109.9 kg, last menstrual period 06/21/2020, SpO2 98 %. Vitals:   01/24/21 0813 01/24/21 1100 01/24/21 1948 01/25/21 0426  BP: (!) 120/55 (!) 130/58 129/62 124/61  Pulse: 87  84 83  Resp:   18 18  Temp: 98.1 F (36.7 C) 97.7 F (36.5 C) 98.3 F (36.8 C) 98.1 F (36.7 C)  TempSrc: Oral Oral Oral Oral  SpO2:   100% 98%  Weight:      Height:       Physical Examination:  General appearance - alert, well appearing, and in no distress Mental status - normal mood, behavior, speech, dress, motor activity, and thought processes Chest - clear to auscultation, no wheezes, rales or rhonchi, symmetric air entry Heart - normal rate and regular rhythm Abdomen - gravid, soft and non-tender Extremities - no edema, no calf tenderness Skin - warm and dry FHT: 135 by doppler  Labs:  Results for orders placed or performed during the hospital encounter of 01/22/21 (from the past 24 hour(s))  Glucose, fasting gestational   Collection Time: 01/25/21  5:13 AM  Result Value Ref Range   Glucose, Fasting-Gestational 82 mg/dL    Medications:  Scheduled  docusate sodium  100 mg Oral Daily   vitamin B-6  25 mg Oral BID   And   doxylamine (Sleep)  25 mg Oral BID   prenatal multivitamin  1 tablet Oral  Q1200   sertraline  50 mg Oral Daily   I have reviewed the patient's current medications.  ASSESSMENT: 13/01/22 [redacted]w[redacted]d Estimated Date of Delivery: 03/28/21  Patient Active Problem List   Diagnosis Date Noted   Depression with suicidal ideation 01/23/2021   Posttraumatic stress disorder 01/23/2021   Major depressive disorder, single episode, severe (HCC) 01/23/2021   Ventricular septal defect (VSD) of fetus affecting management of pregnancy 11/01/2020   Supervision of high risk pregnancy, antepartum 08/30/2020   History of pregnancy induced hypertension 08/30/2020   Anemia    Depression    Intrauterine pregnancy 07/28/2020   Substance use 07/28/2020    PLAN: -from an OB standpoint, pt remains stable -continue doppler daily -Sertraline management per psych- will likely increase to standard dose as pt tolerates  09/27/2020 01/25/2021,7:54 AM

## 2021-01-25 NOTE — Progress Notes (Signed)
Pt accepted to [facility] Psychiatry Inpatient Unit tomorrow 01/26/21 after 0800am.  Bed assignment is 4114 on 4 crisis at [facility]. Phone number [phone] option #2. Attending will be Dr. Romualdo Bolk. Pt can admit anytime tomorrow 01/26/21 after 0800am.  Care Team notified via secure chat: Abigail Butts, RN, Tacy Dura, RN, Ophelia Shoulder, NP, Laurette Schimke, LCSW, Milas Hock, MD, and Myna Hidalgo, DO.  Maryjean Ka, MSW, [facility] 01/25/2021 6:46 PM

## 2021-01-25 NOTE — Progress Notes (Signed)
At 0610 Glucola 75gm po initiated, FBS was 82.  Order placed for 2 hour glucose test to be drawn at 0810.

## 2021-01-25 NOTE — Progress Notes (Signed)
CSW spoke with psych MD and Intake RN regarding [facility] referral for inpatient services. [facility] MD provider is requesting the Pt be IVC'D prior to being transported. [facility] is awaiting to receive IVC paperwork. CSW has informed [facility] medical staff to fax IVC paperwork to [phone]. [facility] has confirmed holding bed until tomorrow. 2nd shift disposition social worker will follow-up with accepting information.    Damita Dunnings, MSW, LCSW-A  3:41 PM 01/25/2021

## 2021-01-25 NOTE — Progress Notes (Signed)
RN to fax IVC paperwork to [facility] [phone] once received on the unit.   Celso Sickle, LCSW Clinical Social Worker [facility] Cell#: [phone]

## 2021-01-25 NOTE — Progress Notes (Signed)
CSW followed up with [facility] Perinatal Psychiatric department regarding [facility] referral. The Intake Specialist Becca confirmed receiving the referral via fax and has agreed to submit it for review. Chari Manning did inform the CSW that there is currently no beds available and encouraged her to follow-up with them daily to check bed availability.   Damita Dunnings, MSW, LCSW-A  11:30 AM 01/25/2021

## 2021-01-26 DIAGNOSIS — F4310 Post-traumatic stress disorder, unspecified: Secondary | ICD-10-CM | POA: Diagnosis not present

## 2021-01-26 DIAGNOSIS — F322 Major depressive disorder, single episode, severe without psychotic features: Secondary | ICD-10-CM | POA: Diagnosis not present

## 2021-01-26 DIAGNOSIS — O99340 Other mental disorders complicating pregnancy, unspecified trimester: Secondary | ICD-10-CM | POA: Insufficient documentation

## 2021-01-26 LAB — WET PREP, GENITAL
Sperm: NONE SEEN
Trich, Wet Prep: NONE SEEN
Yeast Wet Prep HPF POC: NONE SEEN

## 2021-01-26 NOTE — Discharge Summary (Signed)
Physician Discharge Summary  Patient ID: Nichole Barrett MRN: [id number] DOB/AGE: Apr 01, 1995 25 y.o.  Admit date: 01/22/2021 Discharge date: 01/26/2021  Admission Diagnoses:  Discharge Diagnoses:  Principal Problem:   Major depressive disorder, single episode, severe ([place]) Active Problems:   Depression with suicidal ideation   Posttraumatic stress disorder   Discharged Condition: stable  Hospital Course: 25yoG3P2002'@[redacted]w[redacted]d'  admitted due to suicide risk factors.  From an OB perspective, pt has been stable.  Pt has been followed by psych and started on medications.  Plan to transfer to an inpatient psychiatric facility.  Pt has an OB appointment next week.   Consults:  psychiatry  Treatments: PNV, daily monitoring  Discharge Exam: Blood pressure 136/76, pulse 95, temperature 98.1 F (36.7 C), temperature source Oral, resp. rate 18, height '5\' 7"'  (1.702 m), weight 109.9 kg, last menstrual period 06/21/2020, SpO2 99 %. General appearance: alert, cooperative, and no distress Resp: clear to auscultation bilaterally Cardio: regular rate and rhythm GI: gravid, soft and non-tender Extremities: no edema, no calf tenderness bilaterally Skin: warm and dry  Today pt noted yellow discharge- no itching or irritation.  Wet prep obtained, further management pending results.  Disposition: Discharge disposition: [place] Not Defined        Allergies as of 01/26/2021   No Known Allergies      Medication List     TAKE these medications    Blood Pressure Kit Devi 1 Device by Does not apply route as needed.   doxylamine (Sleep) 25 MG tablet Commonly known as: UNISOM Take 1 tablet (25 mg total) by mouth every 8 (eight) hours as needed.   promethazine 12.5 MG tablet Commonly known as: PHENERGAN Take 1 tablet (12.5 mg total) by mouth every 6 (six) hours as needed for nausea or vomiting.   pyridOXINE 25 MG tablet Commonly known as: VITAMIN B-6 Take 1  tablet (25 mg total) by mouth every 8 (eight) hours.        Follow-up [place] [facility] Follow up in 1 week(s).   Specialty: Obstetrics and Gynecology Why: Follow up as scheduled- please call to see if you can move your appointment to later in the week Contact information: [place] [phone]                Signed: Annalee Genta 01/26/2021, 10:01 AM

## 2021-01-26 NOTE — Progress Notes (Signed)
CSW acknowledged consult and completed chart review.  CSW attempted to meet with MOB, however, when CSW arrived, MOB was in the shower.  Per RN, MOB was be transferred within the next 20 minutes. Per RN there are no emergent concerns for CSW.  Blaine Hamper, MSW, LCSW Clinical Social Work [phone]

## 2021-01-26 NOTE — Progress Notes (Signed)
IVC paperwork (3 copies) given to sheriff transporter.

## 2021-01-26 NOTE — Progress Notes (Signed)
Report called to [facility] 4 Crisis unit (hand-off given to [facility], RN).   [employer] non-emergency line called. Sheriff Transport notified of need for IVC patient transport. Per transport, ETA 10:00-10:30am for pick up.

## 2021-01-27 DIAGNOSIS — D649 Anemia, unspecified: Secondary | ICD-10-CM | POA: Insufficient documentation

## 2021-01-31 ENCOUNTER — Ambulatory Visit: Payer: Medicaid Other | Admitting: Clinical

## 2021-01-31 ENCOUNTER — Encounter: Payer: Medicaid Other | Admitting: Nurse Practitioner

## 2021-01-31 DIAGNOSIS — Z91199 Patient's noncompliance with other medical treatment and regimen due to unspecified reason: Secondary | ICD-10-CM

## 2021-02-04 ENCOUNTER — Encounter: Payer: Self-pay | Admitting: Radiology

## 2021-02-07 ENCOUNTER — Encounter: Payer: Medicaid Other | Admitting: Obstetrics and Gynecology

## 2021-02-11 ENCOUNTER — Telehealth (INDEPENDENT_AMBULATORY_CARE_PROVIDER_SITE_OTHER): Payer: Medicaid Other | Admitting: Certified Nurse Midwife

## 2021-02-11 DIAGNOSIS — O99343 Other mental disorders complicating pregnancy, third trimester: Secondary | ICD-10-CM

## 2021-02-11 DIAGNOSIS — O0993 Supervision of high risk pregnancy, unspecified, third trimester: Secondary | ICD-10-CM

## 2021-02-11 DIAGNOSIS — F332 Major depressive disorder, recurrent severe without psychotic features: Secondary | ICD-10-CM

## 2021-02-11 DIAGNOSIS — Z3A33 33 weeks gestation of pregnancy: Secondary | ICD-10-CM

## 2021-02-11 DIAGNOSIS — O99013 Anemia complicating pregnancy, third trimester: Secondary | ICD-10-CM

## 2021-02-11 DIAGNOSIS — D508 Other iron deficiency anemias: Secondary | ICD-10-CM

## 2021-02-11 NOTE — Progress Notes (Signed)
OBSTETRICS PRENATAL VIRTUAL VISIT ENCOUNTER NOTE  Provider location: Center for [facility]   Patient location: Home  I connected with Nichole Barrett on 02/11/21 at  8:35 AM EST by MyChart Video Encounter and verified that I am speaking with the correct person using two identifiers. I discussed the limitations, risks, security and privacy concerns of performing an evaluation and management service virtually and the availability of in person appointments. I also discussed with the patient that there may be a patient responsible charge related to this service. The patient expressed understanding and agreed to proceed. Subjective:  Nichole Barrett is a 25 y.o. G3P2002 at [redacted]w[redacted]d being seen today for ongoing prenatal care.  She is currently monitored for the following issues for this high-risk pregnancy and has Intrauterine pregnancy; Substance use; Supervision of high risk pregnancy, antepartum; History of pregnancy induced hypertension; Anemia; Depression; Ventricular septal defect (VSD) of fetus affecting management of pregnancy; Depression with suicidal ideation; Posttraumatic stress disorder; and Major depressive disorder, single episode, severe (HCC) on their problem list.  Patient reports  very occasional contractions, 1-2 per day. Feels Zoloft is helping, was in-patient at the [facility] for 8 days. Was treated for BV while there and took the entire prescription .  Contractions: Irritability.  .  Movement: Present. Denies any leaking of fluid.   The following portions of the patient's history were reviewed and updated as appropriate: allergies, current medications, past family history, past medical history, past social history, past surgical history and problem list.   Objective:  There were no vitals filed for this visit. Does not have her BP cuff.  Fetal Status:     Movement: Present     General:  Alert, oriented and cooperative. Patient is in no acute  distress.  Respiratory: Normal respiratory effort, no problems with respiration noted  Mental Status: Normal mood and affect. Normal behavior. Normal judgment and thought content.  Rest of physical exam deferred due to type of encounter  Imaging: [place] MFM OB FOLLOW UP  Result Date: 01/18/2021 ----------------------------------------------------------------------  OBSTETRICS REPORT                       (Signed Final 01/18/2021 04:42 pm) ---------------------------------------------------------------------- Patient Info  ID #:       [id number]                          D.O.B.:  1995/06/07 (25 yrs)  Name:       Nichole Barrett                  Visit Date: 01/18/2021 03:48 pm ---------------------------------------------------------------------- Performed By  Attending:        Ma Rings MD         Ref. Address:     [address]                                                             [place], [place]  [id number]  Performed By:     Lelan Pons RDMS       [facility]         [facility]                                                             [place] for                                                             Women  Referred By:      [facility] ---------------------------------------------------------------------- Orders  #  Description                           Code        Ordered By  1  [place] MFM OB FOLLOW UP                   [phone]    Peterson Ao ----------------------------------------------------------------------  #  Order #                     Accession #                Episode #  1  [id number]                   [id number]                 [id number] ---------------------------------------------------------------------- Indications  Substance use affecting pregnancy,             O99.320 F19.10  antepartum (THC, amphetamines - 07/28/20)  [redacted]  weeks gestation of pregnancy                Z3A.30  Antenatal screening for malformations          Z36.3  Poor obstetric history: Previous gestational   O09.299  HTN  Fetal cardiac anomaly affecting pregnancy,     O35.8XX0  antepartum- VSD ---------------------------------------------------------------------- Fetal Evaluation  Num Of Fetuses:         1  Fetal Heart Rate(bpm):  157  Cardiac Activity:       Observed  Presentation:           Cephalic  Placenta:               Posterior  P. Cord Insertion:      Previously Visualized  Amniotic Fluid  AFI FV:      Within normal limits  AFI Sum(cm)     %Tile       Largest Pocket(cm)  12.5  34          4.4  RUQ(cm)       RLQ(cm)       LUQ(cm)        LLQ(cm)  4.4           3.2           1.8            3.1 ---------------------------------------------------------------------- Biometry  BPD:      78.2  mm     G. Age:  31w 3d         76  %    CI:        75.73   %    70 - 86                                                          FL/HC:      21.6   %    19.2 - 21.4  HC:      284.9  mm     G. Age:  31w 2d         47  %    HC/AC:      1.09        0.99 - 1.21  AC:      261.7  mm     G. Age:  30w 2d         50  %    FL/BPD:     78.8   %    71 - 87  FL:       61.6  mm     G. Age:  32w 0d         83  %    FL/AC:      23.5   %    20 - 24  LV:        3.4  mm  Est. FW:    1683  gm    3 lb 11 oz      69  % ---------------------------------------------------------------------- OB History  Gravidity:    3         Term:   2        Prem:   0        SAB:   0  TOP:          0       Ectopic:  0        Living: 2 ---------------------------------------------------------------------- Gestational Age  LMP:           30w 1d        Date:  06/21/20                 EDD:   03/28/21  U/S Today:     31w 2d                                        EDD:   03/20/21  Best:          30w 1d     Det. By:  LMP  (06/21/20)          EDD:   03/28/21  ---------------------------------------------------------------------- Anatomy  Cranium:  Appears normal         LVOT:                   Previously seen  Cavum:                 Previously seen        Aortic Arch:            Previously seen  Ventricles:            Appears normal         Ductal Arch:            Previously seen  Choroid Plexus:        Previously seen        Diaphragm:              Previously seen  Cerebellum:            Previously seen        Stomach:                Appears normal, left                                                                        sided  Posterior Fossa:       Previously seen        Abdomen:                Previously seen  Nuchal Fold:           Previously seen        Abdominal Wall:         Previously seen  Face:                  Orbits and profile     Cord Vessels:           Previously seen                         previously seen  Lips:                  Previously seen        Kidneys:                Appear normal  Palate:                Previously seen        Bladder:                Appears normal  Thoracic:              Previously seen        Spine:                  Previously seen  Heart:                 VSD, muscular          Upper Extremities:      Previously seen  RVOT:                  Previously seen        Lower Extremities:  Previously seen  Other:  VC, 3VV and 3VTV previously visualized. ---------------------------------------------------------------------- Cervix Uterus Adnexa  Cervix  Not visualized (advanced GA >24wks)  Right Ovary  Visualized.  Left Ovary  Visualized. ---------------------------------------------------------------------- Comments  This patient was seen for a follow up growth scan due to a  history of maternal substance use.  A possible VSD was  noted during her last ultrasound exam.  The patient had a  fetal echocardiogram performed with [facility]  cardiology who noted a trivial perimembranous VSD which  may close either  prior to birth or early after birth.  They  recommended that the patient have a postdelivery  echocardiogram of the baby's heart at 1 month of life.  She  denies any problems since her last exam.  She was informed that the fetal growth and amniotic fluid  level appears appropriate for her gestational age.  The patient was encouraged to keep and attend all of her  prenatal appointments as scheduled.  A follow-up growth scan was scheduled in 5 weeks. ----------------------------------------------------------------------                   Johnell Comings, MD Electronically Signed Final Report   01/18/2021 04:42 pm ----------------------------------------------------------------------   Assessment and Plan:  Pregnancy: [id number] at [redacted]w[redacted]d 1. Supervision of high risk pregnancy in third trimester - Doing well, feeling regular and vigorous fetal movement   2. [redacted] weeks gestation of pregnancy - Routine OB care including anticipatory guidance regarding GBS testing at next visit - Has a history of gHTN, needs BP monitoring so will have her schedule a RN BP check for early next week  3. Iron deficiency anemia secondary to inadequate dietary iron intake - stable at 11.2   4. Severe episode of recurrent major depressive disorder, without psychotic features ([place]) - Doing much better on 100mg  zoloft, aware of emergency [place] resources if needed  Preterm labor symptoms and general obstetric precautions including but not limited to vaginal bleeding, contractions, leaking of fluid and fetal movement were reviewed in detail with the patient. I discussed the assessment and treatment plan with the patient. The patient was provided an opportunity to ask questions and all were answered. The patient agreed with the plan and demonstrated an understanding of the instructions. The patient was advised to call back or seek an in-person office evaluation/go to MAU at [facility] for any urgent or concerning  symptoms. Please refer to After Visit Summary for other counseling recommendations.   I provided 10 minutes of face-to-face time during this encounter.  Return in about 2 weeks (around 02/25/2021) for IN-PERSON, LOB/GBS.  Future Appointments  Date Time Provider [place]  02/22/2021  8:30 AM [facility] NURSE [facility]  02/22/2021  8:45 AM [facility] US4 [facility] [place] for [employer], [place]

## 2021-02-11 NOTE — Progress Notes (Signed)
Patient reports "intense" lower back and pelvis pain. She stated that it feels like "labor pain". Ms. Stankovich denies any vaginal bleeding/discharge and stated that baby is still moving daily

## 2021-02-11 NOTE — Progress Notes (Signed)
I connected with  Nichole Barrett on 02/11/21 at  8:35 AM EST by MyChart Virtual Video Visit and verified that I am speaking with the correct person using two identifiers.   I discussed the limitations, risks, security and privacy concerns of performing an evaluation and management service by telephone and the availability of in person appointments. I also discussed with the patient that there may be a patient responsible charge related to this service. The patient expressed understanding and agreed to proceed.  Guy Begin, CMA 02/11/2021  8:40 AM

## 2021-02-16 ENCOUNTER — Other Ambulatory Visit: Payer: Self-pay | Admitting: Advanced Practice Midwife

## 2021-02-16 ENCOUNTER — Other Ambulatory Visit: Payer: Self-pay | Admitting: Student

## 2021-02-16 DIAGNOSIS — O0990 Supervision of high risk pregnancy, unspecified, unspecified trimester: Secondary | ICD-10-CM

## 2021-02-22 ENCOUNTER — Ambulatory Visit: Payer: Medicaid Other

## 2021-02-22 ENCOUNTER — Ambulatory Visit: Payer: Medicaid Other | Attending: Obstetrics

## 2021-03-02 ENCOUNTER — Encounter: Payer: Medicaid Other | Admitting: Obstetrics and Gynecology

## 2021-03-04 ENCOUNTER — Other Ambulatory Visit: Payer: Self-pay | Admitting: Maternal & Fetal Medicine

## 2021-03-04 ENCOUNTER — Ambulatory Visit: Payer: Medicaid Other | Attending: Obstetrics

## 2021-03-04 ENCOUNTER — Ambulatory Visit: Payer: Medicaid Other | Admitting: *Deleted

## 2021-03-04 ENCOUNTER — Other Ambulatory Visit: Payer: Self-pay | Admitting: *Deleted

## 2021-03-04 ENCOUNTER — Encounter: Payer: Self-pay | Admitting: *Deleted

## 2021-03-04 ENCOUNTER — Other Ambulatory Visit: Payer: Self-pay

## 2021-03-04 VITALS — BP 122/65 | HR 105

## 2021-03-04 DIAGNOSIS — Z3A36 36 weeks gestation of pregnancy: Secondary | ICD-10-CM

## 2021-03-04 DIAGNOSIS — O99323 Drug use complicating pregnancy, third trimester: Secondary | ICD-10-CM

## 2021-03-04 DIAGNOSIS — F1910 Other psychoactive substance abuse, uncomplicated: Secondary | ICD-10-CM

## 2021-03-04 DIAGNOSIS — F32A Depression, unspecified: Secondary | ICD-10-CM

## 2021-03-04 DIAGNOSIS — O0990 Supervision of high risk pregnancy, unspecified, unspecified trimester: Secondary | ICD-10-CM | POA: Diagnosis present

## 2021-03-04 DIAGNOSIS — Z8759 Personal history of other complications of pregnancy, childbirth and the puerperium: Secondary | ICD-10-CM | POA: Diagnosis present

## 2021-03-04 DIAGNOSIS — F1210 Cannabis abuse, uncomplicated: Secondary | ICD-10-CM

## 2021-03-04 DIAGNOSIS — O99320 Drug use complicating pregnancy, unspecified trimester: Secondary | ICD-10-CM | POA: Insufficient documentation

## 2021-03-04 DIAGNOSIS — O358XX0 Maternal care for other (suspected) fetal abnormality and damage, not applicable or unspecified: Secondary | ICD-10-CM

## 2021-03-04 MED ORDER — SERTRALINE HCL 100 MG PO TABS: 100.0000 mg | ORAL_TABLET | Freq: Every day | ORAL | 0 refills | Status: DC

## 2021-03-04 MED ORDER — PNV PRENATAL PLUS MULTIVITAMIN 27-1 MG PO TABS: 1.0000 | ORAL_TABLET | Freq: Every day | ORAL | 5 refills | Status: AC

## 2021-03-04 NOTE — Progress Notes (Signed)
Ms. Blauvelt reports that she has tabs of zoloft left. I reordered them today.  Novella Olive, MD

## 2021-03-07 ENCOUNTER — Other Ambulatory Visit: Payer: Self-pay | Admitting: Maternal & Fetal Medicine

## 2021-03-08 ENCOUNTER — Other Ambulatory Visit: Payer: Self-pay

## 2021-03-08 ENCOUNTER — Ambulatory Visit (INDEPENDENT_AMBULATORY_CARE_PROVIDER_SITE_OTHER): Payer: Medicaid Other | Admitting: Student

## 2021-03-08 ENCOUNTER — Other Ambulatory Visit (HOSPITAL_COMMUNITY)
Admission: RE | Admit: 2021-03-08 | Discharge: 2021-03-08 | Disposition: A | Discharge status: Home / Self Care | Payer: Medicaid Other | Source: Ambulatory Visit | Attending: Obstetrics and Gynecology | Admitting: Obstetrics and Gynecology

## 2021-03-08 VITALS — BP 132/65 | HR 81 | Wt 259.3 lb

## 2021-03-08 DIAGNOSIS — Z5941 Food insecurity: Secondary | ICD-10-CM

## 2021-03-08 DIAGNOSIS — O0933 Supervision of pregnancy with insufficient antenatal care, third trimester: Secondary | ICD-10-CM

## 2021-03-08 DIAGNOSIS — Z3A37 37 weeks gestation of pregnancy: Secondary | ICD-10-CM | POA: Diagnosis not present

## 2021-03-08 DIAGNOSIS — O0990 Supervision of high risk pregnancy, unspecified, unspecified trimester: Secondary | ICD-10-CM

## 2021-03-08 DIAGNOSIS — O0930 Supervision of pregnancy with insufficient antenatal care, unspecified trimester: Secondary | ICD-10-CM | POA: Insufficient documentation

## 2021-03-08 MED ORDER — SERTRALINE HCL 100 MG PO TABS: 100.0000 mg | ORAL_TABLET | Freq: Every day | ORAL | 0 refills | Status: DC

## 2021-03-08 NOTE — Progress Notes (Signed)
° °  PRENATAL VISIT NOTE  Subjective:  Nichole Barrett is a 25 y.o. G3P2002 at [redacted]w[redacted]d being seen today for ongoing prenatal care.  She is currently monitored for the following issues for this high-risk pregnancy and has Intrauterine pregnancy; Substance use; Supervision of high risk pregnancy, antepartum; History of pregnancy induced hypertension; Anemia; Depression; Ventricular septal defect (VSD) of fetus affecting management of pregnancy; Depression with suicidal ideation; Posttraumatic stress disorder; and Major depressive disorder, single episode, severe (HCC) on their problem list.  Patient reports no complaints. She reports that she is feeling well.   Contractions: Irritability. Vag. Bleeding: None.  Movement: Present. Denies leaking of fluid.   The following portions of the patient's history were reviewed and updated as appropriate: allergies, current medications, past family history, past medical history, past social history, past surgical history and problem list.   Objective:   Vitals:   03/08/21 1322  BP: 132/65  Pulse: 81  Weight: 259 lb 4.8 oz (117.6 kg)    Fetal Status: Fetal Heart Rate (bpm): 145 Fundal Height: 38 cm Movement: Present  Presentation: Vertex  General:  Alert, oriented and cooperative. Patient is in no acute distress.  Skin: Skin is warm and dry. No rash noted.   Cardiovascular: Normal heart rate noted  Respiratory: Normal respiratory effort, no problems with respiration noted  Abdomen: Soft, gravid, appropriate for gestational age.  Pain/Pressure: Present     Pelvic: Cervical exam deferred        Extremities: Normal range of motion.  Edema: Trace  Mental Status: Normal mood and affect. Normal behavior. Normal judgment and thought content.   Assessment and Plan:  Pregnancy: G3P2002 at [redacted]w[redacted]d 1. Food insecurity  - AMBULATORY REFERRAL TO [facility]  2. Supervision of high risk pregnancy, antepartum -she is still taking zoloft; feels that she is  doing ok.  -will draw hga1c bc patient didn't have 1 hour or 2 hour test; just fasting and random at [facility] Fasting was 82 and random was 123 - Cervicovaginal ancillary only( [place]) - Culture, beta strep (group b only) -confirmed vertex by presentation  Preterm labor symptoms and general obstetric precautions including but not limited to vaginal bleeding, contractions, leaking of fluid and fetal movement were reviewed in detail with the patient. Please refer to After Visit Summary for other counseling recommendations.   Return in about 1 week (around 03/15/2021), or My Chart and also schedule inperson visits at 39, 40 weeks.  Future Appointments  Date Time Provider Department Center  03/11/2021  2:30 PM [facility] NURSE [facility]  03/11/2021  2:45 PM [facility] US7 [facility]  03/17/2021  1:45 PM [facility] NURSE [facility]  03/17/2021  2:00 PM [facility] US1 [facility]    Marylene Land, CNM

## 2021-03-09 LAB — HEMOGLOBIN A1C
Est. average glucose Bld gHb Est-mCnc: 94 mg/dL
Hgb A1c MFr Bld: 4.9 % (ref 4.8–5.6)

## 2021-03-10 LAB — CERVICOVAGINAL ANCILLARY ONLY
Chlamydia: NEGATIVE
Comment: NEGATIVE
Comment: NORMAL
Neisseria Gonorrhea: NEGATIVE

## 2021-03-11 ENCOUNTER — Ambulatory Visit: Payer: Medicaid Other | Attending: Obstetrics and Gynecology

## 2021-03-11 ENCOUNTER — Ambulatory Visit: Payer: Medicaid Other | Admitting: *Deleted

## 2021-03-11 ENCOUNTER — Other Ambulatory Visit: Payer: Self-pay | Admitting: Maternal & Fetal Medicine

## 2021-03-11 ENCOUNTER — Other Ambulatory Visit: Payer: Self-pay

## 2021-03-11 VITALS — BP 117/48 | HR 92

## 2021-03-11 DIAGNOSIS — O99343 Other mental disorders complicating pregnancy, third trimester: Secondary | ICD-10-CM | POA: Diagnosis not present

## 2021-03-11 DIAGNOSIS — O0990 Supervision of high risk pregnancy, unspecified, unspecified trimester: Secondary | ICD-10-CM | POA: Insufficient documentation

## 2021-03-11 DIAGNOSIS — O99320 Drug use complicating pregnancy, unspecified trimester: Secondary | ICD-10-CM | POA: Diagnosis present

## 2021-03-11 DIAGNOSIS — F332 Major depressive disorder, recurrent severe without psychotic features: Secondary | ICD-10-CM | POA: Insufficient documentation

## 2021-03-11 DIAGNOSIS — Z8759 Personal history of other complications of pregnancy, childbirth and the puerperium: Secondary | ICD-10-CM

## 2021-03-11 DIAGNOSIS — F1210 Cannabis abuse, uncomplicated: Secondary | ICD-10-CM | POA: Insufficient documentation

## 2021-03-11 DIAGNOSIS — F32A Depression, unspecified: Secondary | ICD-10-CM | POA: Diagnosis present

## 2021-03-11 DIAGNOSIS — O35BXX0 Maternal care for other (suspected) fetal abnormality and damage, fetal cardiac anomalies, not applicable or unspecified: Secondary | ICD-10-CM

## 2021-03-11 DIAGNOSIS — Z3A37 37 weeks gestation of pregnancy: Secondary | ICD-10-CM

## 2021-03-11 NOTE — Procedures (Signed)
Spencer Peterkin 10-26-1995 [redacted]w[redacted]d  Fetus A Non-Stress Test Interpretation for 03/11/21  Indication:  gestational hypertension  Fetal Heart Rate A Mode: External Baseline Rate (A): 160 bpm Variability: Moderate Accelerations: 15 x 15 Decelerations: None  Uterine Activity Mode: Toco Contraction Frequency (min): rare Resting Tone Palpated: Relaxed  Interpretation (Fetal Testing) Nonstress Test Interpretation: Reactive Overall Impression: Reassuring for gestational age Comments: tracing reviewed by Dr. Judeth Cornfield

## 2021-03-12 LAB — CULTURE, BETA STREP (GROUP B ONLY): Strep Gp B Culture: NEGATIVE

## 2021-03-14 ENCOUNTER — Telehealth (INDEPENDENT_AMBULATORY_CARE_PROVIDER_SITE_OTHER): Payer: Medicaid Other | Admitting: Family Medicine

## 2021-03-14 ENCOUNTER — Encounter: Payer: Self-pay | Admitting: Family Medicine

## 2021-03-14 DIAGNOSIS — O133 Gestational [pregnancy-induced] hypertension without significant proteinuria, third trimester: Secondary | ICD-10-CM

## 2021-03-14 DIAGNOSIS — F332 Major depressive disorder, recurrent severe without psychotic features: Secondary | ICD-10-CM

## 2021-03-14 DIAGNOSIS — O99343 Other mental disorders complicating pregnancy, third trimester: Secondary | ICD-10-CM

## 2021-03-14 DIAGNOSIS — O09893 Supervision of other high risk pregnancies, third trimester: Secondary | ICD-10-CM

## 2021-03-14 DIAGNOSIS — O368130 Decreased fetal movements, third trimester, not applicable or unspecified: Secondary | ICD-10-CM

## 2021-03-14 DIAGNOSIS — Z3A38 38 weeks gestation of pregnancy: Secondary | ICD-10-CM

## 2021-03-14 DIAGNOSIS — O35BXX0 Maternal care for other (suspected) fetal abnormality and damage, fetal cardiac anomalies, not applicable or unspecified: Secondary | ICD-10-CM

## 2021-03-14 DIAGNOSIS — Z8759 Personal history of other complications of pregnancy, childbirth and the puerperium: Secondary | ICD-10-CM

## 2021-03-14 DIAGNOSIS — O0990 Supervision of high risk pregnancy, unspecified, unspecified trimester: Secondary | ICD-10-CM

## 2021-03-14 MED ORDER — BLOOD PRESSURE KIT DEVI: 1.0000 | 0 refills | Status: AC

## 2021-03-14 NOTE — Progress Notes (Signed)
OBSTETRICS PRENATAL VIRTUAL VISIT ENCOUNTER NOTE  Provider location: [facility] for [place] [facility] [place] [facility]   Patient location: Home  I connected with Nichole Barrett on 03/14/21 at  9:35 AM EST by MyChart Video Encounter and verified that I am speaking with the correct person using two identifiers. I discussed the limitations, risks, security and privacy concerns of performing an evaluation and management service virtually and the availability of in person appointments. I also discussed with the patient that there may be a patient responsible charge related to this service. The patient expressed understanding and agreed to proceed.  Subjective:  Nichole Barrett is a 25 y.o. G3P2002 at 59w0dbeing seen today for ongoing prenatal care.  She is currently monitored for the following issues for this high-risk pregnancy and has Intrauterine pregnancy; Substance use; Supervision of high risk pregnancy, antepartum; History of pregnancy induced hypertension; Anemia; Depression; Ventricular septal defect (VSD) of fetus affecting management of pregnancy; Depression with suicidal ideation; Posttraumatic stress disorder; Major depressive disorder, single episode, severe ([place]; and Limited prenatal care on their problem list.  Patient reports decreased fetal movement since last night, but hasn't been paying attention much yet this morning. Feels that her mood is manageable currently, going to call her psychiatrist today to schedule a follow up visit. Contractions: Irritability. Vag. Bleeding: None.  Movement: (!) Decreased. Denies any leaking of fluid.   The following portions of the patient's history were reviewed and updated as appropriate: allergies, current medications, past family history, past medical history, past social history, past surgical history and problem list.   Objective:  There were no vitals filed for this visit.  Fetal Status:     Movement: (!) Decreased     General:   Alert, oriented and cooperative. Patient is in no acute distress.  Respiratory: Normal respiratory effort, no problems with respiration noted  Mental Status: Normal mood and affect. Normal behavior. Normal judgment and thought content.  Rest of physical exam deferred due to type of encounter   Assessment and Plan:  Pregnancy: G3P2002 at 371w0d. Supervision of high risk pregnancy, antepartum Doing well, however reporting less movement. Discussed having a sugary drink/snack and laying on left lateral side for kick counts for 2 hours at most. If movement still decreased, present to MAU. She agrees.  - Blood Pressure Monitoring (BLOOD PRESSURE KIT) DEVI; 1 Device by Does not apply route once a week.  Dispense: 1 each; Refill: 0 - [facility] AMB BABYSCRIPTS SCHEDULE OPTIMIZATION  2. [redacted] weeks gestation of pregnancy  3. History of pregnancy induced hypertension BP cuff ordered as above as she does not have one at home.   4. Ventricular septal defect (VSD) of fetus affecting management of pregnancy Trivial perimembranous VSD on fetal echo, follow up in 1 month after birth.   5. Recurrent major depressive disorder, without psychotic features ([place] currently per her report without SI/HI. Cont zoloft. Encouraged her to follow up with her psychiatrist. Discussed [facility] [facility] as needed.   Term labor symptoms and general obstetric precautions including but not limited to vaginal bleeding, contractions, leaking of fluid and fetal movement were reviewed in detail with the patient. I discussed the assessment and treatment plan with the patient. The patient was provided an opportunity to ask questions and all were answered. The patient agreed with the plan and demonstrated an understanding of the instructions. The patient was advised to call back or seek an in-person office evaluation/go to [facility]  any urgent or concerning symptoms. Please refer to After Visit  Summary for other counseling recommendations.   I provided 15 minutes of face-to-face time during this encounter.   Future Appointments  Date Time Provider [place]  03/17/2021  1:45 PM [facility] NURSE [facility]  03/17/2021  2:00 PM [facility] US1 [facility]  03/23/2021  1:15 PM Helaine Chess [facility]  03/29/2021  2:35 PM [place], Mervyn Skeeters, CNM [facility] [place], [place] for [employer], [place]

## 2021-03-17 ENCOUNTER — Ambulatory Visit: Payer: Medicaid Other

## 2021-03-20 ENCOUNTER — Encounter (HOSPITAL_COMMUNITY): Payer: Self-pay | Admitting: Obstetrics & Gynecology

## 2021-03-20 ENCOUNTER — Inpatient Hospital Stay (HOSPITAL_COMMUNITY)
Admission: AD | Admit: 2021-03-20 | Discharge: 2021-03-22 | DRG: 806 | Disposition: A | Discharge status: Home / Self Care | Payer: Medicaid Other | Attending: Obstetrics and Gynecology | Admitting: Obstetrics and Gynecology

## 2021-03-20 ENCOUNTER — Other Ambulatory Visit: Payer: Self-pay

## 2021-03-20 DIAGNOSIS — F1290 Cannabis use, unspecified, uncomplicated: Secondary | ICD-10-CM | POA: Diagnosis present

## 2021-03-20 DIAGNOSIS — O35BXX0 Maternal care for other (suspected) fetal abnormality and damage, fetal cardiac anomalies, not applicable or unspecified: Secondary | ICD-10-CM | POA: Diagnosis present

## 2021-03-20 DIAGNOSIS — Z20822 Contact with and (suspected) exposure to covid-19: Secondary | ICD-10-CM | POA: Diagnosis present

## 2021-03-20 DIAGNOSIS — Z3A38 38 weeks gestation of pregnancy: Secondary | ICD-10-CM

## 2021-03-20 DIAGNOSIS — F32A Depression, unspecified: Secondary | ICD-10-CM | POA: Diagnosis present

## 2021-03-20 DIAGNOSIS — Z87891 Personal history of nicotine dependence: Secondary | ICD-10-CM

## 2021-03-20 DIAGNOSIS — O99344 Other mental disorders complicating childbirth: Secondary | ICD-10-CM | POA: Diagnosis present

## 2021-03-20 DIAGNOSIS — O479 False labor, unspecified: Secondary | ICD-10-CM | POA: Diagnosis present

## 2021-03-20 DIAGNOSIS — O99324 Drug use complicating childbirth: Secondary | ICD-10-CM | POA: Diagnosis present

## 2021-03-20 DIAGNOSIS — O26893 Other specified pregnancy related conditions, third trimester: Secondary | ICD-10-CM | POA: Diagnosis present

## 2021-03-20 LAB — CBC
HCT: 32.1 % — ABNORMAL LOW (ref 36.0–46.0)
Hemoglobin: 10.1 g/dL — ABNORMAL LOW (ref 12.0–15.0)
MCH: 28.9 pg (ref 26.0–34.0)
MCHC: 31.5 g/dL (ref 30.0–36.0)
MCV: 92 fL (ref 80.0–100.0)
Platelets: 179 10*3/uL (ref 150–400)
RBC: 3.49 MIL/uL — ABNORMAL LOW (ref 3.87–5.11)
RDW: 14.2 % (ref 11.5–15.5)
WBC: 12.1 10*3/uL — ABNORMAL HIGH (ref 4.0–10.5)
nRBC: 0 % (ref 0.0–0.2)

## 2021-03-20 LAB — TYPE AND SCREEN
ABO/RH(D): A POS
Antibody Screen: NEGATIVE

## 2021-03-20 LAB — RESP PANEL BY RT-PCR (FLU A&B, COVID) ARPGX2
Influenza A by PCR: NEGATIVE
Influenza B by PCR: NEGATIVE
SARS Coronavirus 2 by RT PCR: NEGATIVE

## 2021-03-20 LAB — RAPID URINE DRUG SCREEN, HOSP PERFORMED
Amphetamines: NOT DETECTED
Barbiturates: NOT DETECTED
Benzodiazepines: NOT DETECTED
Cocaine: NOT DETECTED
Opiates: NOT DETECTED
Tetrahydrocannabinol: POSITIVE — AB

## 2021-03-20 LAB — RPR: RPR Ser Ql: NONREACTIVE

## 2021-03-20 MED ORDER — OXYCODONE-ACETAMINOPHEN 5-325 MG PO TABS: 2.0000 | ORAL_TABLET | ORAL | Status: DC | PRN

## 2021-03-20 MED ORDER — ONDANSETRON HCL 4 MG/2ML IJ SOLN: 4.0000 mg | Freq: Four times a day (QID) | INTRAMUSCULAR | Status: DC | PRN

## 2021-03-20 MED ORDER — ONDANSETRON HCL 4 MG PO TABS: 4.0000 mg | ORAL_TABLET | ORAL | Status: DC | PRN

## 2021-03-20 MED ORDER — ACETAMINOPHEN 325 MG PO TABS: 650.0000 mg | ORAL_TABLET | ORAL | Status: DC | PRN

## 2021-03-20 MED ORDER — OXYCODONE-ACETAMINOPHEN 5-325 MG PO TABS: 1.0000 | ORAL_TABLET | ORAL | Status: DC | PRN

## 2021-03-20 MED ORDER — ACETAMINOPHEN 325 MG PO TABS
650.0000 mg | ORAL_TABLET | ORAL | Status: DC | PRN
  Administered 2021-03-20 (×2): 650 mg via ORAL
  Filled 2021-03-20 (×2): qty 2

## 2021-03-20 MED ORDER — ONDANSETRON HCL 4 MG/2ML IJ SOLN: 4.0000 mg | INTRAMUSCULAR | Status: DC | PRN

## 2021-03-20 MED ORDER — LACTATED RINGERS IV SOLN: INTRAVENOUS | Status: DC

## 2021-03-20 MED ORDER — OXYTOCIN-SODIUM CHLORIDE 30-0.9 UT/500ML-% IV SOLN: 2.5000 [IU]/h | INTRAVENOUS | Status: DC

## 2021-03-20 MED ORDER — DIBUCAINE (PERIANAL) 1 % EX OINT: 1.0000 "application " | TOPICAL_OINTMENT | CUTANEOUS | Status: DC | PRN

## 2021-03-20 MED ORDER — ZOLPIDEM TARTRATE 5 MG PO TABS: 5.0000 mg | ORAL_TABLET | Freq: Every evening | ORAL | Status: DC | PRN

## 2021-03-20 MED ORDER — OXYTOCIN BOLUS FROM INFUSION: 333.0000 mL | Freq: Once | INTRAVENOUS | Status: DC

## 2021-03-20 MED ORDER — LACTATED RINGERS IV SOLN: 500.0000 mL | INTRAVENOUS | Status: DC | PRN

## 2021-03-20 MED ORDER — SENNOSIDES-DOCUSATE SODIUM 8.6-50 MG PO TABS
2.0000 | ORAL_TABLET | ORAL | Status: DC
  Administered 2021-03-20 – 2021-03-21 (×2): 2 via ORAL
  Filled 2021-03-20 (×5): qty 2

## 2021-03-20 MED ORDER — SOD CITRATE-CITRIC ACID 500-334 MG/5ML PO SOLN: 30.0000 mL | ORAL | Status: DC | PRN

## 2021-03-20 MED ORDER — DIPHENHYDRAMINE HCL 25 MG PO CAPS: 25.0000 mg | ORAL_CAPSULE | Freq: Four times a day (QID) | ORAL | Status: DC | PRN

## 2021-03-20 MED ORDER — SIMETHICONE 80 MG PO CHEW: 80.0000 mg | CHEWABLE_TABLET | ORAL | Status: DC | PRN

## 2021-03-20 MED ORDER — OXYTOCIN 10 UNIT/ML IJ SOLN: 10.0000 [IU] | Freq: Once | INTRAMUSCULAR | Status: AC

## 2021-03-20 MED ORDER — LIDOCAINE HCL (PF) 1 % IJ SOLN: 30.0000 mL | INTRAMUSCULAR | Status: DC | PRN

## 2021-03-20 MED ORDER — OXYTOCIN 10 UNIT/ML IJ SOLN
INTRAMUSCULAR | Status: AC
  Administered 2021-03-20: 02:00:00 10 [IU] via INTRAMUSCULAR
  Filled 2021-03-20: qty 1

## 2021-03-20 MED ORDER — BENZOCAINE-MENTHOL 20-0.5 % EX AERO
1.0000 "application " | INHALATION_SPRAY | CUTANEOUS | Status: DC | PRN
  Administered 2021-03-20: 1 via TOPICAL
  Filled 2021-03-20: qty 56

## 2021-03-20 MED ORDER — PRENATAL MULTIVITAMIN CH
1.0000 | ORAL_TABLET | Freq: Every day | ORAL | Status: DC
  Administered 2021-03-20 – 2021-03-22 (×3): 1 via ORAL
  Filled 2021-03-20 (×3): qty 1

## 2021-03-20 MED ORDER — SERTRALINE HCL 50 MG PO TABS
100.0000 mg | ORAL_TABLET | Freq: Every day | ORAL | Status: DC
  Administered 2021-03-20 – 2021-03-22 (×3): 100 mg via ORAL
  Filled 2021-03-20 (×3): qty 2

## 2021-03-20 MED ORDER — WITCH HAZEL-GLYCERIN EX PADS: 1.0000 "application " | MEDICATED_PAD | CUTANEOUS | Status: DC | PRN

## 2021-03-20 MED ORDER — TETANUS-DIPHTH-ACELL PERTUSSIS 5-2.5-18.5 LF-MCG/0.5 IM SUSY: 0.5000 mL | PREFILLED_SYRINGE | Freq: Once | INTRAMUSCULAR | Status: DC

## 2021-03-20 MED ORDER — IBUPROFEN 600 MG PO TABS
600.0000 mg | ORAL_TABLET | Freq: Four times a day (QID) | ORAL | Status: DC
  Administered 2021-03-20 – 2021-03-22 (×8): 600 mg via ORAL
  Filled 2021-03-20 (×12): qty 1

## 2021-03-20 MED ORDER — COCONUT OIL OIL
1.0000 "application " | TOPICAL_OIL | Status: DC | PRN
  Administered 2021-03-20: 1 via TOPICAL

## 2021-03-20 NOTE — Lactation Note (Addendum)
This note was copied from a baby's chart. Lactation Consultation Note  Patient Name: Nichole Barrett [id number] Date: 03/20/2021 Reason for consult: Initial assessment;NICU baby;Early term 37-38.6wks Age:23 hours  Lactation tried to conduct an initial consult with Nichole Barrett. Her son, Nichole Barrett, is 62 hours old. She was on the phone upon entry, and she briefly paused her call to let me know that they are trying to work out child care challenges at home so her support person can come to the hospital. She asked to defer this consult.   I noted pump kit in the room, but it was unopened. I suggested that her RN could help her initiate pumping, or she can call lactation when she's on the NICU floor, and I would help her initiate. I suggested that she could bring her pump kit to NICU (she states that she is ready to go to the NICU).  Nichole Barrett verbalized understanding. Consult deferred.  1240 - Lactation noted that Nichole Barrett was in the NICU. She brought her kit with her. I set up her kit and provided her with a brief introduction to the pump settings. I recommended that she pump q3 hours (both breasts for 15 minutes). Nichole Barrett was on video phone and communicating with her children at home. I concluded the consult.    Feeding Mother's Current Feeding Choice: Breast Milk and Formula Nipple Type: Nfant Extra Slow Flow (gold)   Consult Status Consult Status: Follow-up Date: 03/20/21 Follow-up type: In-patient    Lenore Manner 03/20/2021, 12:09 PM

## 2021-03-20 NOTE — Discharge Summary (Signed)
° °  Postpartum Discharge Summary ° ° ° °   °Patient Name: Nichole Barrett °DOB: 01/28/1996 °MRN: [id number] ° °Date of admission: 03/20/2021 °Delivery date:03/20/2021  °Delivering provider: WILLIAMS, MARIE L  °Date of discharge: 03/22/2021 ° °Admitting diagnosis: Uterine contractions during pregnancy [O47.9] °Intrauterine pregnancy: [redacted]w[redacted]d     °Secondary diagnosis:  Principal Problem: °  Uterine contractions during pregnancy °Active Problems: °  Vaginal delivery °  Meconium in amniotic fluid ° °Additional problems: substance use in pregnancy, Depression, fetal VSD, History of gestational hypertension   °Discharge diagnosis: Term Pregnancy Delivered                                              °Post partum procedures: °Augmentation: AROM °Complications: None ° °Hospital course: Onset of Labor With Vaginal Delivery      °25 y.o. yo G3P2002 at [redacted]w[redacted]d was admitted in Active Labor on 03/20/2021. Patient had an uncomplicated labor course as follows:  °Membrane Rupture Time/Date: 2:09 AM ,03/20/2021   °Delivery Method:Vaginal, Spontaneous  °Episiotomy: None  °Lacerations:  None  °Patient had an uncomplicated postpartum course.  She is ambulating, tolerating a regular diet, passing flatus, and urinating well. Patient is discharged home in stable condition on 03/22/21. ° °Newborn Data: °Birth date:03/20/2021  °Birth time:2:15 AM  °Gender:Female  °Living status:Living  °Apgars:7 ,9  °Weight:3070 g  ° °Magnesium Sulfate received: No °BMZ received: No °Rhophylac:N/A °MMR:No °T-DaP: no °Flu: No °Transfusion:No ° °Physical exam  °Vitals:  ° 03/21/21 1519 03/21/21 2003 03/22/21 0403 03/22/21 0816  °BP: (!) 115/47 102/75 122/60 129/71  °Pulse: (!) 102 (!) 103 82 97  °Resp: 18 18 17 18  °Temp: 98.1 °F (36.7 °C) 98.2 °F (36.8 °C) 98.5 °F (36.9 °C) 98.4 °F (36.9 °C)  °TempSrc: Oral Oral Oral Oral  °SpO2: 98% 99% 99% 99%  °Weight:      °Height:      ° °General: alert, cooperative, and no distress °Lochia: appropriate °Uterine Fundus:  firm °Incision:  °DVT Evaluation: No evidence of DVT seen on physical exam. °Labs: °Lab Results  °Component Value Date  ° WBC 12.1 (H) 03/20/2021  ° HGB 10.1 (L) 03/20/2021  ° HCT 32.1 (L) 03/20/2021  ° MCV 92.0 03/20/2021  ° PLT 179 03/20/2021  ° °CMP Latest Ref Rng & Units 01/25/2021  °Glucose 70 - 99 mg/dL 123(H)  °BUN 6 - 20 mg/dL -  °Creatinine 0.44 - 1.00 mg/dL -  °Sodium 135 - 145 mmol/L -  °Potassium 3.5 - 5.1 mmol/L -  °Chloride 98 - 111 mmol/L -  °CO2 22 - 32 mmol/L -  °Calcium 8.9 - 10.3 mg/dL -  °Total Protein 6.5 - 8.1 g/dL -  °Total Bilirubin 0.3 - 1.2 mg/dL -  °Alkaline Phos 38 - 126 U/L -  °AST 15 - 41 U/L -  °ALT 0 - 44 U/L -  ° °Edinburgh Score: °Edinburgh Postnatal Depression Scale Screening Tool 03/20/2021  °I have been able to laugh and see the funny side of things. 1  °I have looked forward with enjoyment to things. 1  °I have blamed myself unnecessarily when things went wrong. 2  °I have been anxious or worried for no good reason. 2  °I have felt scared or panicky for no good reason. 0  °Things have been getting on top of me. 1  °I have been so unhappy that I have   have had difficulty sleeping. 1  I have felt sad or miserable. 2  I have been so unhappy that I have been crying. 1  The thought of harming myself has occurred to me. 0  Edinburgh Postnatal Depression Scale Total 11      After visit meds:  Allergies as of 03/22/2021   No Known Allergies      Medication List     STOP taking these medications    doxylamine (Sleep) 25 MG tablet Commonly known as: UNISOM       TAKE these medications    Blood Pressure Kit Devi 1 Device by Does not apply route once a week.   ibuprofen 600 MG tablet Commonly known as: ADVIL Take 1 tablet (600 mg total) by mouth every 6 (six) hours.   PNV Prenatal Plus Multivitamin 27-1 MG Tabs Take 1 tablet by mouth daily.   pyridOXINE 25 MG tablet Commonly known as: VITAMIN B-6 Take 1 tablet (25 mg total) by mouth every 8 (eight)  hours. What changed:  how much to take when to take this reasons to take this   sertraline 100 MG tablet Commonly known as: ZOLOFT Take 1 tablet (100 mg total) by mouth daily.               Durable Medical Equipment  (From admission, onward)           Start     Ordered   03/21/21 1038  For home use only DME double electric breast pump  Once       Comments: Z39.1   03/21/21 1038            Please schedule this patient for Postpartum visit in: 1 week with the following provider: Any provider In-Person For C/S patients schedule nurse incision check in weeks 2 weeks: no High risk pregnancy complicated by: HTN Delivery mode:  SVD Anticipated Birth Control:  Nexplanon PP Procedures needed: BP check  Edinburgh: pending, but has history of recent suicidal ideations and depression.  Schedule Integrated BH visit: yes  NICU relevant baby issues   Discharge home in stable condition Infant Feeding: Breast Infant Disposition:home with mother Discharge instruction: per After Visit Summary and Postpartum booklet. Activity: Advance as tolerated. Pelvic rest for 6 weeks.  Diet: routine diet Anticipated Birth Control: Nexplanon and as outpatient Postpartum Appointment: 5 weeks Additional Postpartum F/U:  Future Appointments: Future Appointments  Date Time Provider Rhea  04/04/2021 10:15 AM [facility]   Follow up Visit:  [place] [facility] [place] [facility] Follow up in 5 week(s).   Specialty: Obstetrics and Gynecology Why: postpartum visit Contact information: [address] [place] [address] [phone]                    03/22/2021 Florian Buff, MD

## 2021-03-20 NOTE — MAU Note (Signed)
Pt transferred to LD room 216. Staff in room on arrival with CNM.

## 2021-03-20 NOTE — H&P (Signed)
Nichole Barrett is a 25 y.o. female presenting via EMS  for Active Labor.  Found to be completely dilated in MAU and escorted by CNM to Labor and Delivery. Upon entry, she felt the urge to push.   She was intermittently unresponsive, requiring loud shouting and sternal rub to awaken Denied any drug [place] "ever" to me but told RN she used MJ this week. (Hx amphetamines and MJ in May 2022)  No support person with her, Nichole Barrett home with her children.    Has been followed on a few occasions in [facility] office, but missed many appointments due to social issues and admission for suicidal ideations during pregnancy.    Pregnancy has been remarkable for  Patient Active Problem List   Diagnosis Date Noted   Uterine contractions during pregnancy 03/20/2021   Vaginal delivery 03/20/2021   Meconium in amniotic fluid 03/20/2021   Limited prenatal care 03/08/2021   Anemia 01/27/2021   Major depressive disorder affecting pregnancy 01/26/2021   Depressive disorder 01/23/2021   Depression with suicidal ideation 01/23/2021   Posttraumatic stress disorder 01/23/2021   Major depressive disorder, single episode, severe (HCC) 01/23/2021   Ventricular septal defect (VSD) of fetus affecting management of pregnancy 11/01/2020   Supervision of high risk pregnancy, antepartum 08/30/2020   History of pregnancy induced hypertension 08/30/2020   Intrauterine pregnancy 07/28/2020   Substance use 07/28/2020    . OB History     Gravida  3   Para  2   Term  2   Preterm      AB      Living  2      SAB      IAB      Ectopic      Multiple      Live Births  2          Past Medical History:  Diagnosis Date   Anemia    Hypertension    Pregnancy induced hypertension    Past Surgical History:  Procedure Laterality Date   NO PAST SURGERIES     Family History: family history is not on file. Social History:  reports that she quit smoking about 7 months ago. Her smoking use included cigarettes. She has  never used smokeless tobacco. She reports current drug use. Drug: Marijuana. She reports that she does not drink alcohol.     Maternal Diabetes: No Genetic Screening: Declined Maternal Ultrasounds/Referrals: Fetal Heart Anomalies Trivial VSD/membranous Fetal Ultrasounds or other Referrals:  Fetal echo Maternal Substance Abuse:  Yes:  Type: Marijuana, Other: Amphetamines Significant Maternal Medications:  None Significant Maternal Lab Results:  None Other Comments:   History of Major Depression, substance abuse, suicidal ideations  Review of Systems  Unable to perform ROS: Mental status change (intermittently unresponsive)  Constitutional:  Negative for chills and fever.  Respiratory:  Negative for shortness of breath.   Gastrointestinal:  Positive for abdominal pain and rectal pain.  Genitourinary:  Positive for pelvic pain.  Psychiatric/Behavioral:  Positive for confusion.   Maternal Medical History:  Reason for admission: Contractions.   Contractions: Frequency: regular.   Perceived severity is strong.   Fetal activity: Perceived fetal activity is normal.   Prenatal complications: PIH and substance abuse.   No bleeding or preterm labor.   Prenatal Complications - Diabetes: none.  Dilation: 10 Effacement (%): 100 Exam by:: danielle simpson, cnm Blood pressure (!) 131/56, pulse 100, temperature 98.9 F (37.2 C), temperature source Oral, resp. rate 17, last menstrual period 06/21/2020. Maternal  Exam:  Uterine Assessment: Contraction strength is firm.  Contraction frequency is regular.  Abdomen: Patient reports no abdominal tenderness. Fetal presentation: vertex Introitus: Normal vulva. Normal vagina.  Ferning test: not done.  Nitrazine test: not done. Amniotic fluid character: meconium stained. Pelvis: adequate for delivery.   Cervix: Cervix evaluated by digital exam.     Fetal Exam Fetal Monitor Review: Mode: ultrasound.   Baseline rate: 150.  Fetal State Assessment:  Unable to monitor due to imminent delivery and patient refusal Physical Exam Constitutional:      General: She is not in acute distress.    Appearance: She is not toxic-appearing.     Comments: Resistant to moving, feeling a lot of pressure, intermittently unresponsive  HENT:     Head: Normocephalic.  Cardiovascular:     Rate and Rhythm: Normal rate.  Pulmonary:     Effort: Pulmonary effort is normal.  Abdominal:     General: There is no distension.     Tenderness: There is no abdominal tenderness. There is no guarding.  Genitourinary:    General: Normal vulva.     Comments: Dilation: 10 Dilation Complete Date: 03/20/21 Dilation Complete Time: 0157 Effacement (%): 100 Presentation: Vertex Exam by:: danielle simpson, cnm  Musculoskeletal:        General: Normal range of motion.     Cervical back: Normal range of motion.  Skin:    General: Skin is warm and dry.  Neurological:     Mental Status: She is disoriented.    Prenatal labs: ABO, Rh: --/--/A POS (10/29 2046) Antibody: NEG (10/29 2046) Rubella: <0.90 (10/29 2046) RPR: NON REACTIVE (10/29 2046)  HBsAg: NON REACTIVE (10/29 2046)  HIV: Non Reactive (10/29 2046)  GBS: Negative/-- (12/13 1347)   Assessment/Plan: Single IUP at [redacted]w[redacted]d Active Labor, Second stage Bulging membranes Meconium stained amniotic fluid  Admit to Labor and Delivery Routine orders  No time for IV or epidural   Anticipate SVD    Wynelle Bourgeois 03/20/2021, 2:55 AM

## 2021-03-20 NOTE — MAU Note (Signed)
Nichole Barrett is a 25 y.o. at [redacted]w[redacted]d here in MAU reporting: Contractions - Denies SROM, vaginal bleeding or bloody show. Pt transferred to stretcher. Complaining of feeling urge to push. Camelia Eng in room. SVE 10cm with bulging bag.  First call and LD charge called and given report.    Pain score: 10 Vitals:   03/20/21 0229 03/20/21 0230  BP: (!) 131/56   Pulse: 100   Resp:  17  Temp: 98.9 F (37.2 C)      FHT:150bpm

## 2021-03-21 MED ORDER — MEASLES, MUMPS & RUBELLA VAC IJ SOLR: 0.5000 mL | Freq: Once | INTRAMUSCULAR | Status: DC

## 2021-03-21 NOTE — Progress Notes (Signed)
Post Partum Day 1 Subjective: no complaints, up ad lib, voiding, tolerating PO, and + flatus  Objective: Blood pressure (!) 123/51, pulse 96, temperature 98.2 F (36.8 C), temperature source Oral, resp. rate 17, height 5\' 7"  (1.702 m), weight 112.9 kg, last menstrual period 06/21/2020, SpO2 98 %.  Physical Exam:  General: alert, cooperative, and no distress Lochia: appropriate Uterine Fundus: firm Incision: n/a DVT Evaluation: No evidence of DVT seen on physical exam. No cords or calf tenderness.  Recent Labs    03/20/21 0249  HGB 10.1*  HCT 32.1*    Assessment/Plan: Routine PP Course  - Rh pos  - SW consult for any additional needs given limited PNC  - Baby in NICU - baby boy. She desires circ. Consent reviewed with pt including risks and benefits.   - She is breast and formula feeding  - Desires outpt Nexplanon. Offered inpt but she declines.   - Anticipate D/C home (or to NICU) tomorrow    LOS: 1 day   03/22/21 03/21/2021, 6:59 AM

## 2021-03-21 NOTE — Lactation Note (Signed)
This note was copied from a baby's chart.  NICU Lactation Consultation Note  Patient Name: Nichole Barrett [id number] Date: 03/21/2021 Age:23 hours   Subjective Reason for consult: Follow-up assessment Mother states that she is pumping frequently. Her baby is mostly bottle feeding but she plans to initiate bf'ing. Mother states that baby will transfer to her patient room later today.   Mother has a spectra pump at home but does not have pumping kit for it. She requests a referral for stork pump. RN to place order prior to maternal d/c.   Mother is aware of [place] services and will request bf assistance today prn.  Objective Infant data: Mother's Current Feeding Choice: Breast Milk  Infant feeding assessment Scale for Readiness: 2 Scale for Quality: 2     Maternal data: [id number]  Vaginal, Spontaneous Pumping frequency: q3-4  Assessment Infant: Feeding Status: Ad lib   Intervention/Plan Interventions: Education (referral for stork pump)  Tools: Pump Pump Education: Setup, frequency, and cleaning  Plan: Consult Status: Follow-up  NICU Follow-up type: Verify absence of engorgement; Verify onset of copious milk  Mother to continue pumping until baby is bf'ing regularly.  Mother to request [facility] services today prn.  Gwynne Edinger 03/21/2021, 11:13 AM

## 2021-03-21 NOTE — Clinical Social Work Maternal (Signed)
CLINICAL SOCIAL WORK MATERNAL/CHILD NOTE  Patient Details  Name: Nichole Barrett MRN: [id number] Date of Birth: 1996/02/04  Date:  03/21/2021  Clinical Social Worker Initiating Note:  Laurey Arrow Date/Time: Initiated:  03/21/21/1305     Child's Name:  Nichole Barrett   Biological Parents:  Mother, Father   Need for Interpreter:  None   Reason for Referral:  Behavioral Health Concerns, Current Substance Use/Substance Use During Pregnancy  , Other (Comment) (EDPS 11)   Address:  [address] [place] [address] [place] [address].   Phone number:  [phone] (home)     Additional phone number: FOB's number is [phone]  Household Members/Support Persons (HM/SP):   Household Member/Support Person 2, Household Member/Support Person 3, Household Member/Support Person 1   HM/SP Name Relationship DOB or Age  HM/SP -1 Shelby Dubin FOB 04/28/1988  HM/SP -2 Tyrease Kimmings son 07/05/2017  HM/SP -3 Consuelo Pandy daughter 09/21/2019  HM/SP -4        HM/SP -5        HM/SP -6        HM/SP -7        HM/SP -8          Natural Supports (not living in the home):  Parent, Extended Family   Professional Supports: None (CSW provided MOB with a list of local outpatient therapist.)   Employment: Unemployed   Type of Work:     Education:  [place] arranged:    Museum/gallery curator Resources:  [employer]   Other Resources:  Physicist, medical   (MOB plans to update her [facility] application and transfer it to [employer]  CSW provided MOB with WIC contact information.)   Cultural/Religious Considerations Which May Impact Care:  None Reported  Strengths:  Pediatrician chosen, Home prepared for child  , Ability to meet basic needs  , Understanding of illness, Psychotropic Medications   Psychotropic Medications:  Zoloft      Pediatrician:       Pediatrician List:   [place]      Pediatrician Fax Number:    Risk Factors/Current Problems:  Substance Use  , Mental Health Concerns     Cognitive State:  Alert  , Insightful  , Linear Thinking     Mood/Affect:  Comfortable  , Interested  , Calm  , Happy  , Relaxed     CSW Assessment: CSW met with MOB in at infant's bedside in room 348.  MOB was arriving when CSW arrived.  MOB was on a video call when she arrived on the unit.  CSW offered to return at a later time and MOB declined. CSW requested that MOB end video call in order to respect MOB's privacy, and MOB agreed. MOB was polite, easy  to engage, and receptive to meeting with CSW.   CSW reviewed MOB's Edinburgh results and asked about MOB's MH hx.  MOB openly shared that she was dx with anx/dep around age 70/17 however felt "really over the edge in Oct." MOB communicated that she was IVC's in Oct 2022 and was inpatient at [facility] "For a few days."  Per MOB she was recommended to have outpatient counseling and she was prescribed Zoloft.  MOB shared that she takes medication as prescribed however is looking for a new therapist.  CSW provided MOB with local therapy  resources. CSW provided education regarding the baby blues period vs. perinatal mood disorders, discussed treatment and gave resources for mental health follow up if concerns arise.  CSW recommends self-evaluation during the postpartum time period using the New Mom Checklist from Postpartum Progress and encouraged MOB to contact a medical professional if symptoms are noted at any time.  MOB presented with insight and awareness and did not demonstrate any acute MH symptoms. CSW assessed for safety and MOB denied SI, HI, and DV. MOB reported feeling comfortable seeking help if needed and she shared she has a good support team.    CSW asked about MOB's SA hx.  MOB acknowledged smoking marijuana during her pregnancy and reported she last smoked 2 months ago.  She also reported eating an edible 2  days ago. CSW reviewed hospital's SA policy and MOB was understanding.  MOB is aware that infant's UDS was positive for [facility] and CSW will make a report to [facility] (a report was made to [place] [facility]). Per CPS there are no barriers to infant's discharge. CSW will continued to monitor infant's CDS and will report results to CPS. MOB declined SA resources and reported that she does not have a substance use problem.    Per MOB, she has all essential items to care for infant and  including a pack N play and a car seat (used not expired). MOB share she will use her Food Stamps to purchase formula until she can get approved with [facility].  CSW will continue to offer resources and supports to family while infant remains in NICU.    CSW Plan/Description:  Sudden Infant Death Syndrome (SIDS) Education, Perinatal Mood and Anxiety Disorder (PMADs) Education, No Further Intervention Required/No Barriers to Discharge, Other Patient/Family Education, [place], Other Information/Referral to [employer], Child Protective Service Report  , CSW Will Continue to Monitor Umbilical Cord Tissue Drug Screen Results and Make Report if Warranted   Laurey Arrow, MSW, LCSW Clinical Social Work [phone]  Dimple Nanas, LCSW 03/21/2021, 1:21 PM

## 2021-03-22 MED ORDER — COMPLETENATE 29-1 MG PO CHEW: 1.0000 | CHEWABLE_TABLET | Freq: Every day | ORAL | Status: DC

## 2021-03-22 MED ORDER — IBUPROFEN 600 MG PO TABS: 600.0000 mg | ORAL_TABLET | Freq: Four times a day (QID) | ORAL | 0 refills | Status: AC

## 2021-03-22 NOTE — Lactation Note (Signed)
This note was copied from a baby's chart. Lactation Consultation Note  Patient Name: Nichole Barrett OHYWV'P Date: 03/22/2021 Reason for consult: Follow-up assessment Age:24 hours  Mother giving baby bottle of formula when LC entered room. Mother denies needing assistance with breastfeeding. She is primarily pumping and bottle feeding. She has stork pump in room. Feed on demand with cues.  Goal 8-12+ times per day after first 24 hrs.   Feeding Mother's Current Feeding Choice: Breast Milk and Formula Nipple Type: Nfant Slow Flow (purple)  Interventions Interventions: Education  Discharge Pump: Stork Pump  Consult Status Consult Status: PRN    Hardie Pulley 03/22/2021, 9:17 AM

## 2021-03-22 NOTE — Progress Notes (Signed)
Post Partum Day 2 Subjective: no complaints  Objective: Blood pressure 129/71, pulse 97, temperature 98.4 F (36.9 C), temperature source Oral, resp. rate 18, height 5\' 7"  (1.702 m), weight 112.9 kg, last menstrual period 06/21/2020, SpO2 99 %.  Physical Exam:  General: alert, cooperative, and no distress Lochia: appropriate Uterine Fundus: firm Incision:  DVT Evaluation: No evidence of DVT seen on physical exam.  Recent Labs    03/20/21 0249  HGB 10.1*  HCT 32.1*    Assessment/Plan: Plan for discharge tomorrow and Breastfeeding Circumcision later today  LOS: 2 days   03/22/21 03/22/2021, 10:00 AM

## 2021-03-23 ENCOUNTER — Encounter: Payer: Medicaid Other | Admitting: Certified Nurse Midwife

## 2021-03-23 LAB — SURGICAL PATHOLOGY

## 2021-03-23 NOTE — BH Specialist Note (Signed)
Integrated Behavioral Health via Telemedicine Visit  03/23/2021 Nichole Barrett [id number]  Number of Integrated Behavioral Health visits: 2 Session Start time: 10:20  Session End time: 10:33 Total time:  13  Referring Provider: Milas Hock, MD Patient/Family location: Home [facility] Provider location: [facility]  All persons participating in visit: Patient Nichole Barrett and Nichole Barrett   Types of Service: Individual psychotherapy and Video visit  I connected with Nichole Barrett's  n/a  via  Telephone or Video Enabled Telemedicine Application  (Video is Caregility application) and verified that I am speaking with the correct person using two identifiers. Discussed confidentiality: Yes   I discussed the limitations of telemedicine and the availability of in person appointments.  Discussed there is a possibility of technology failure and discussed alternative modes of communication if that failure occurs.  I discussed that engaging in this telemedicine visit, they consent to the provision of behavioral healthcare and the services will be billed under their insurance.  Patient and/or legal guardian expressed understanding and consented to Telemedicine visit: Yes   Presenting Concerns: Patient and/or family reports the following symptoms/concerns: Requests refill on Zoloft and referral to [facility] for ongoing therapy; pt feels she is coping well today.  Duration of problem: Ongoing; Severity of problem: mild  Patient and/or Family's Strengths/Protective Factors: Sense of purpose  Goals Addressed: Patient will:  Maintain reduction of symptoms of: anxiety and depression   Increase knowledge and/or ability of: healthy habits   Demonstrate ability to: Increase healthy adjustment to current life circumstances  Progress towards Goals: Ongoing  Interventions: Interventions utilized:  Psychoeducation and/or  Health Education and Supportive Reflection Standardized Assessments completed: GAD-7 and PHQ 9  Patient and/or Family Response: Pt agrees with treatment plan  Assessment: Patient currently experiencing MDD, unspecified remission (as previously diagnosed via psychiatry).   Patient may benefit from psychoeducation and brief therapeutic interventions regarding coping with symptoms of depression .  Plan: Follow up with behavioral health clinician on : Call Agron Swiney at [phone], as needed. Behavioral recommendations:  -Continue taking Zoloft 100mg  as prescribed -Continue with plan to attend psychiatry appointment tomorrow at [facility] -Accept referral to [facility] for ongoing therapy  Referral(s): Integrated [facility] (In Clinic) and [facility] (LME/Outside Clinic)  I discussed the assessment and treatment plan with the patient and/or parent/guardian. They were provided an opportunity to ask questions and all were answered. They agreed with the plan and demonstrated an understanding of the instructions.   They were advised to call back or seek an in-person evaluation if the symptoms worsen or if the condition fails to improve as anticipated.  [employer], LCSW Depression screen [facility] 2/9 04/04/2021 03/14/2021 03/08/2021 01/23/2021 01/17/2021  Decreased Interest 1 1 2 2  0  Down, Depressed, Hopeless 0 1 1 3 2   PHQ - 2 Score 1 2 3 5 2   Altered sleeping 0 1 0 1 1  Tired, decreased energy 1 1 0 3 1  Change in appetite 0 0 0 1 3  Feeling bad or failure about yourself  0 1 2 2 1   Trouble concentrating 0 0 0 1 1  Moving slowly or fidgety/restless 0 0 0 0 0  Suicidal thoughts 0 0 1 2 1   PHQ-9 Score 2 5 6 15 10   Difficult doing work/chores - - - Very difficult -   GAD 7 : Generalized Anxiety Score 04/04/2021 03/14/2021 03/08/2021  01/17/2021  Nervous, Anxious, on Edge 0 3 1 1   Control/stop worrying 0 1 1 1   Worry  too much - different things 1 3 1 3   Trouble relaxing 1 1 1 1   Restless 0 0 0 0  Easily annoyed or irritable 1 1 1 1   Afraid - awful might happen 0 0 1 1  Total GAD 7 Score 3 9 6  8

## 2021-03-25 ENCOUNTER — Ambulatory Visit: Payer: Medicaid Other

## 2021-03-25 ENCOUNTER — Other Ambulatory Visit: Payer: Medicaid Other

## 2021-03-29 ENCOUNTER — Ambulatory Visit: Payer: Medicaid Other

## 2021-03-29 ENCOUNTER — Encounter: Payer: Medicaid Other | Admitting: Student

## 2021-04-01 ENCOUNTER — Telehealth (HOSPITAL_COMMUNITY): Payer: Self-pay | Admitting: *Deleted

## 2021-04-01 NOTE — Telephone Encounter (Signed)
Phone voicemail message left to return nurse call.  Duffy Rhody, RN 04-01-2021 at 9:44am

## 2021-04-04 ENCOUNTER — Ambulatory Visit: Payer: Medicaid Other | Admitting: Clinical

## 2021-04-04 DIAGNOSIS — F329 Major depressive disorder, single episode, unspecified: Secondary | ICD-10-CM

## 2021-04-04 NOTE — Patient Instructions (Signed)
[facility] [address] [place], [place] [address] [phone] (main office) [phone] (Zakira Ressel's office)   

## 2021-04-04 NOTE — Addendum Note (Signed)
Addended by: Hulda Marin C on: 04/04/2021 11:04 AM   Modules accepted: Orders

## 2021-04-05 ENCOUNTER — Other Ambulatory Visit: Payer: Self-pay | Admitting: Obstetrics and Gynecology

## 2021-04-05 DIAGNOSIS — F329 Major depressive disorder, single episode, unspecified: Secondary | ICD-10-CM

## 2021-04-05 MED ORDER — SERTRALINE HCL 100 MG PO TABS: 100.0000 mg | ORAL_TABLET | Freq: Every day | ORAL | 2 refills | Status: AC

## 2021-04-25 ENCOUNTER — Ambulatory Visit: Payer: Medicaid Other | Admitting: Obstetrics and Gynecology

## 2021-06-23 ENCOUNTER — Ambulatory Visit (HOSPITAL_COMMUNITY): Payer: Self-pay | Admitting: Clinical

## 2021-06-23 ENCOUNTER — Encounter (HOSPITAL_COMMUNITY): Payer: Self-pay

## 2022-01-28 IMAGING — US US OB < 14 WEEKS - US OB TV
1 series · 15 of 28 positions shown · non-contrast
Comparison: None

CLINICAL DATA: Abdominal pain affecting first trimester of
pregnancy, weakness, LMP 06/21/2020; no quantitative beta HCG yet
available for correlation

EXAM:
OBSTETRIC <14 WK US AND TRANSVAGINAL OB US
TECHNIQUE: Both transabdominal and transvaginal ultrasound examinations were
performed for complete evaluation of the gestation as well as the
maternal uterus, adnexal regions, and pelvic cul-de-sac.
Transvaginal technique was performed to assess early pregnancy.

[Series 1: us ob < 14 weeks - us ob tv · 15 of 70 slices shown]
[im 1/70]
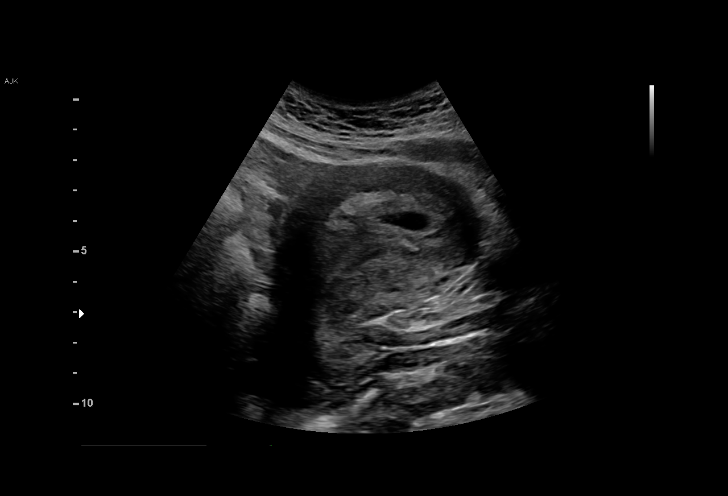
[im 6/70]
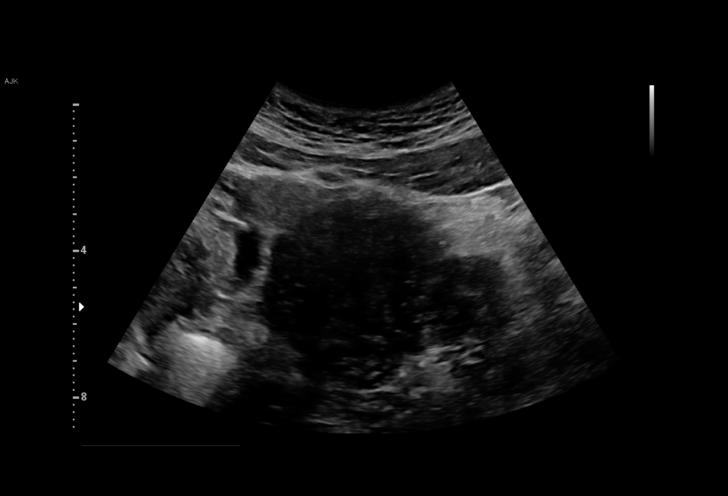
[im 11/70]
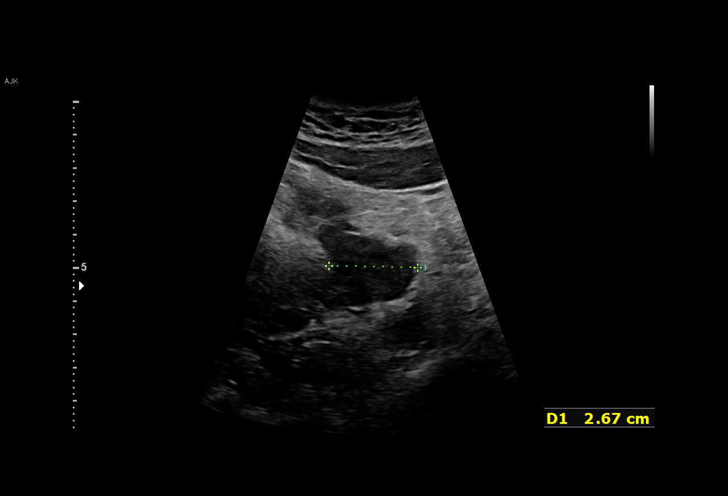
[im 16/70]
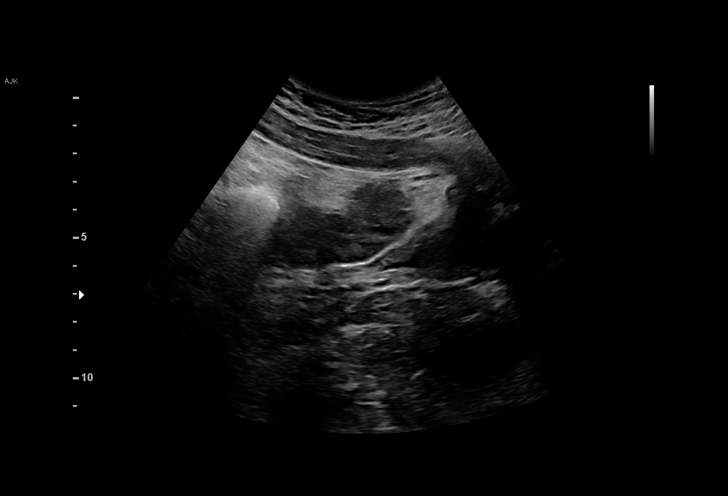
[im 21/70]
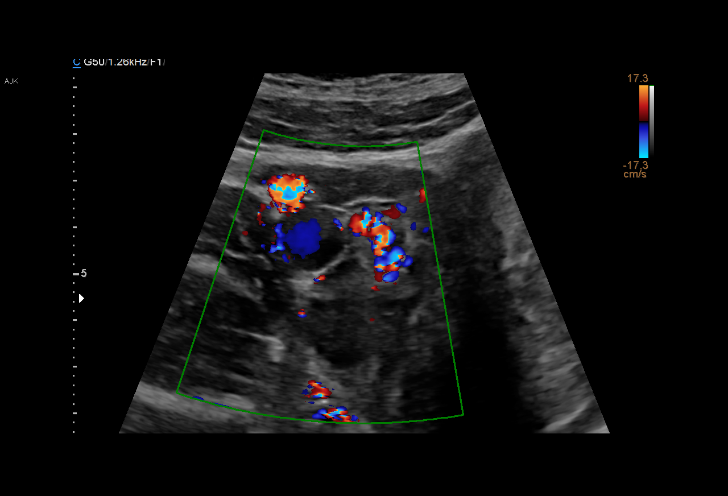
[im 26/70]
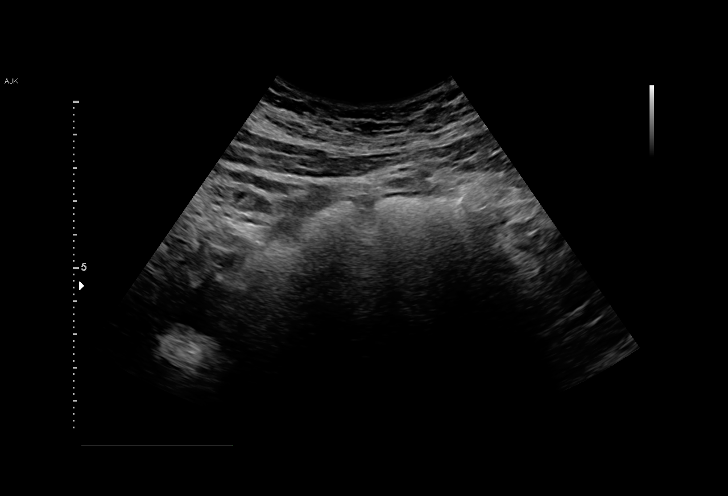
[im 31/70]
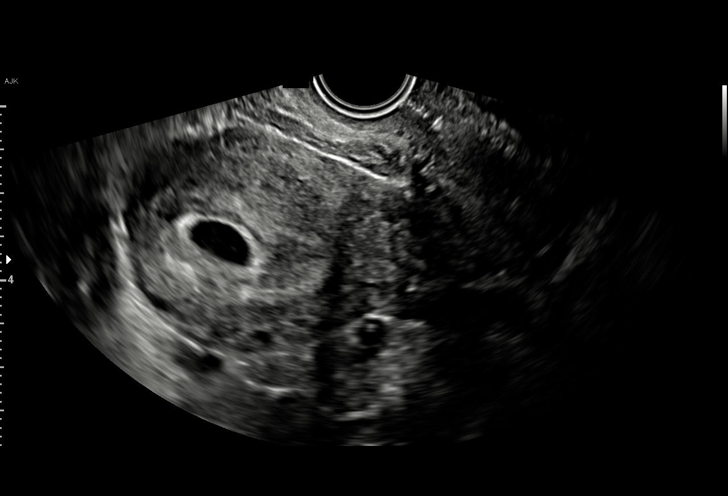
[im 36/70]
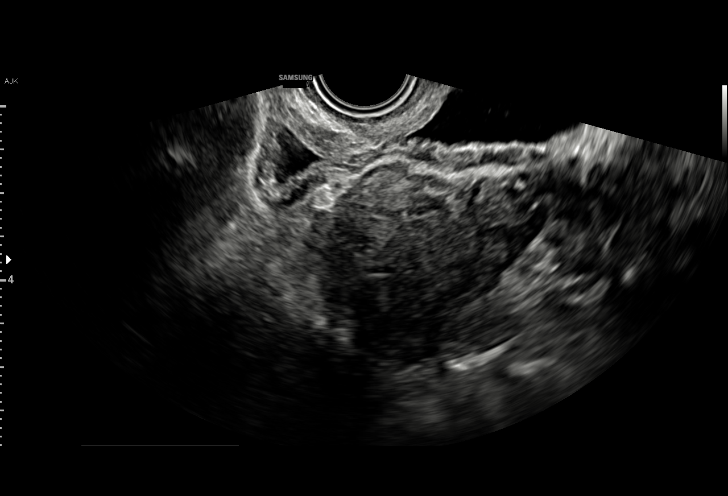
[im 39/70]
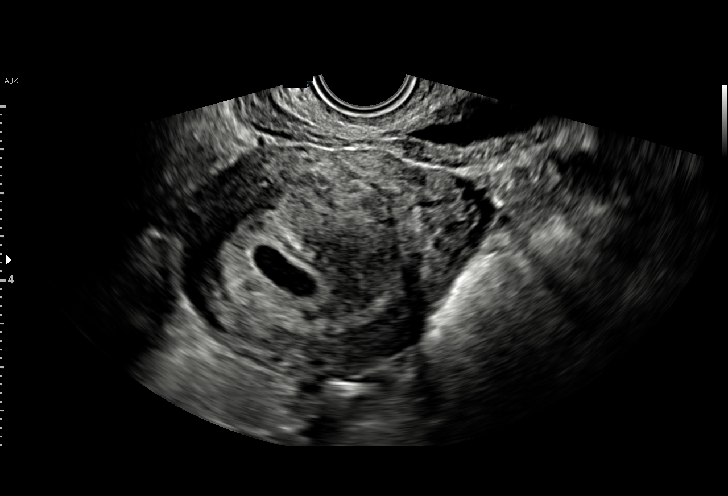
[im 44/70]
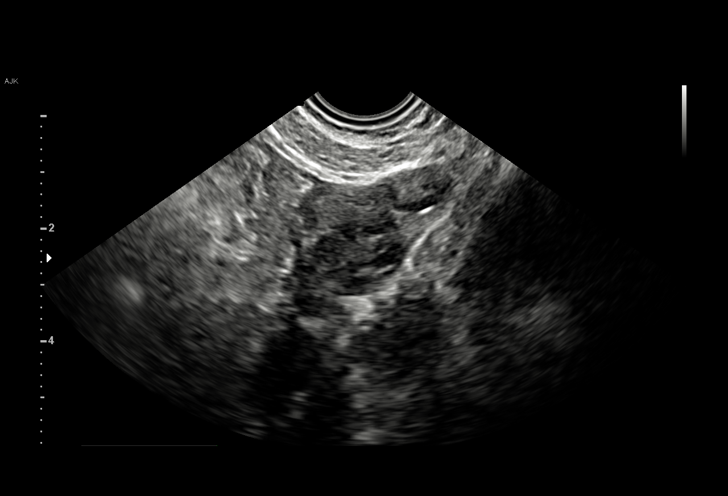
[im 49/70]
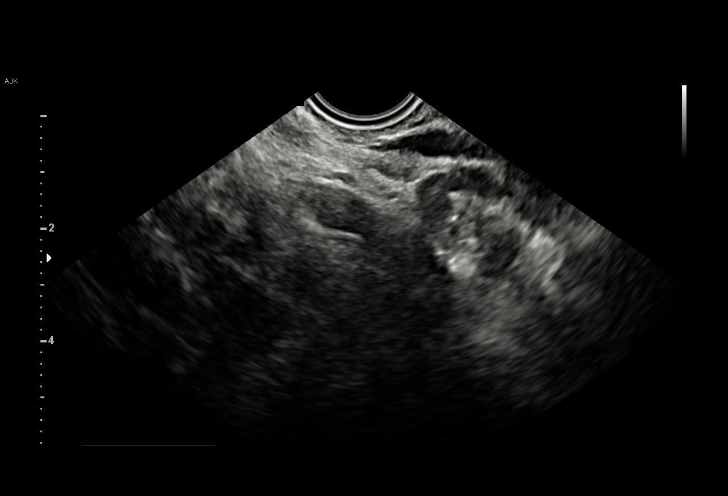
[im 54/70]
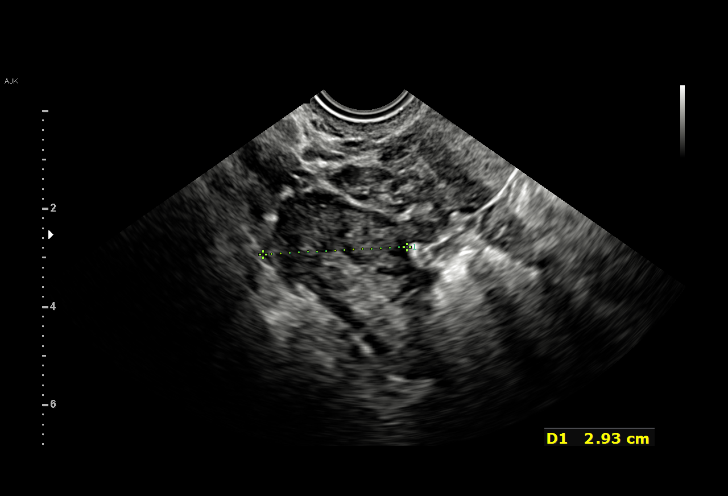
[im 59/70]
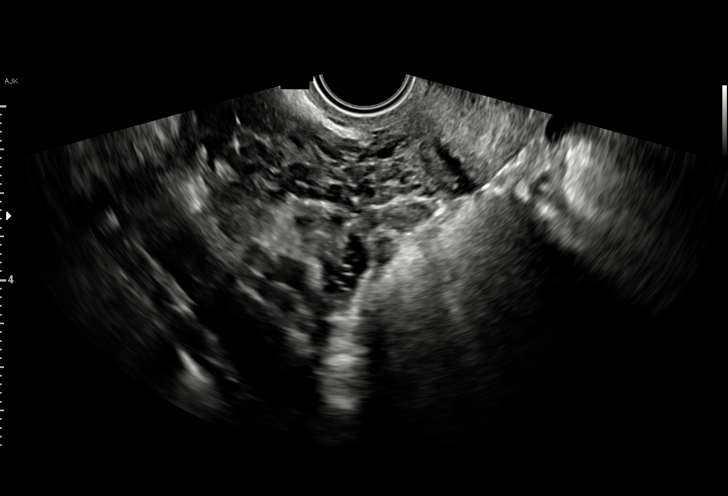
[im 64/70]
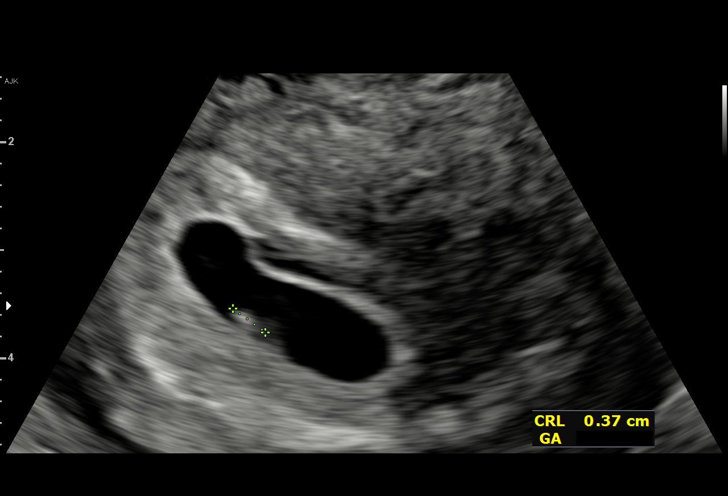
[im 70/70]
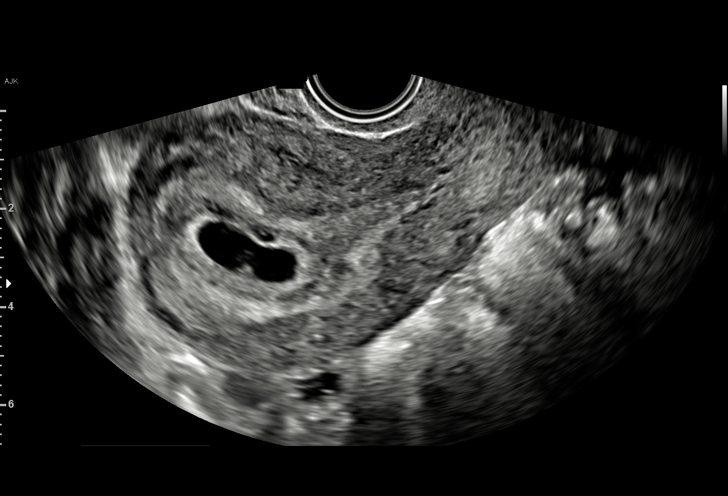

[15 of 28 positions shown; findings below may reference images not displayed]

FINDINGS: Intrauterine gestational sac: Present, single

Yolk sac:  Present

Embryo:  Present

Cardiac Activity: Present

Heart Rate: 117 bpm

CRL:  3.9 mm   6 w   0 d                  US EDC: 03/23/2021

Subchorionic hemorrhage:  None visualized.

Maternal uterus/adnexa:

Uterus anteverted, otherwise normal appearance.

RIGHT ovary normal size and morphology, 4.2 x 2.6 x 2.9 cm.

LEFT ovary normal size and morphology, 3.6 x 1.8 x 2.1 cm.

No free pelvic fluid or adnexal masses.
IMPRESSION: Single live intrauterine gestation at 6 weeks 0 days EGA by
crown-rump length.

No acute abnormalities.

## 2022-05-04 IMAGING — US US MFM OB DETAIL+14 WK
1 series · 12 of 28 positions shown · non-contrast
Comparison: none

[Series 1: us mfm ob detail+14 wk · 139 acquisitions, 12 frames shown]
[im 6/139]
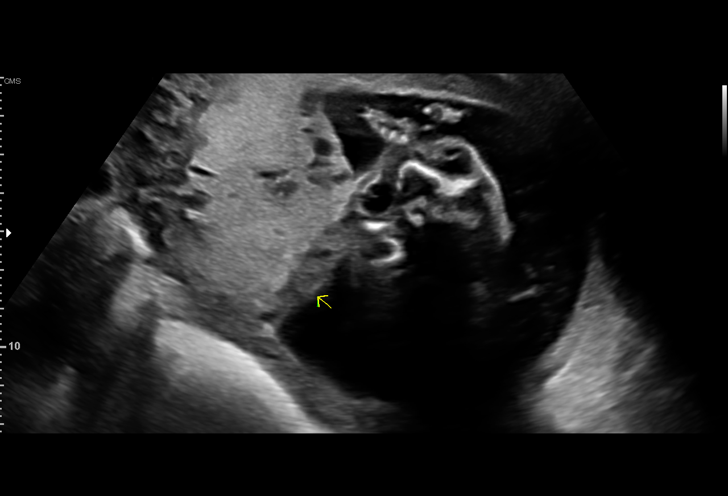
[im 16/139]
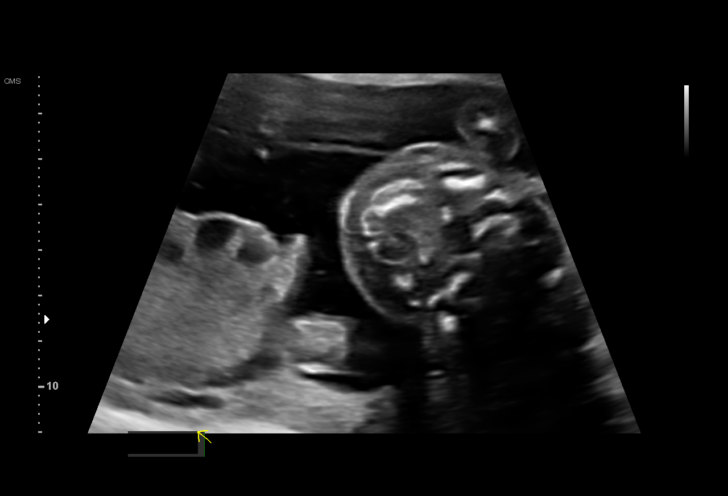
[im 26/139]
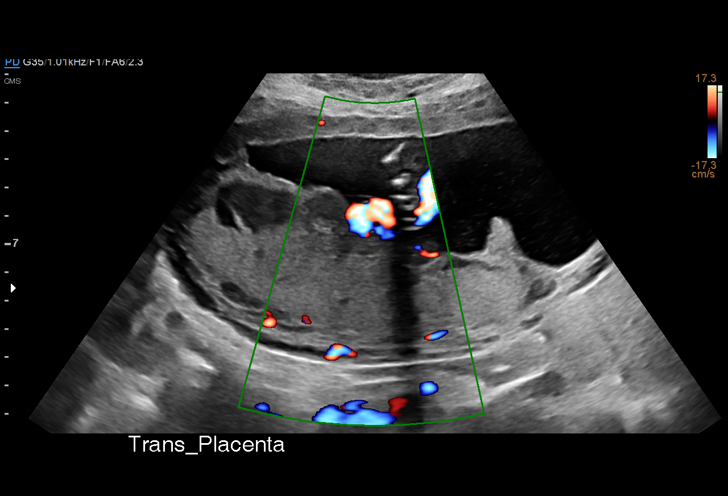
[im 41/139]
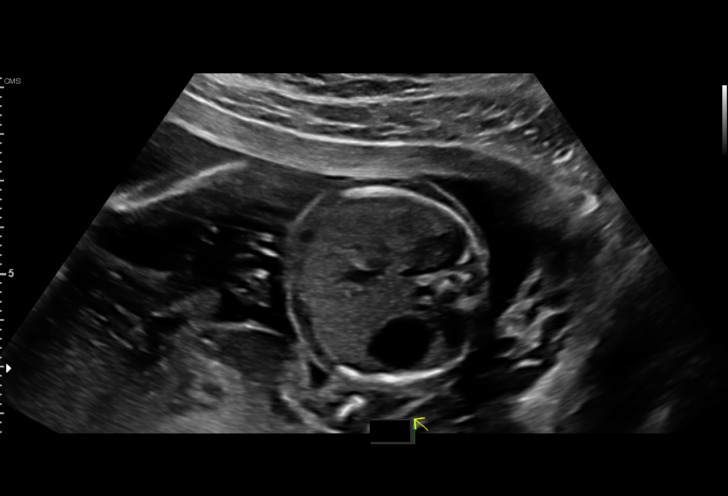
[im 52/139]
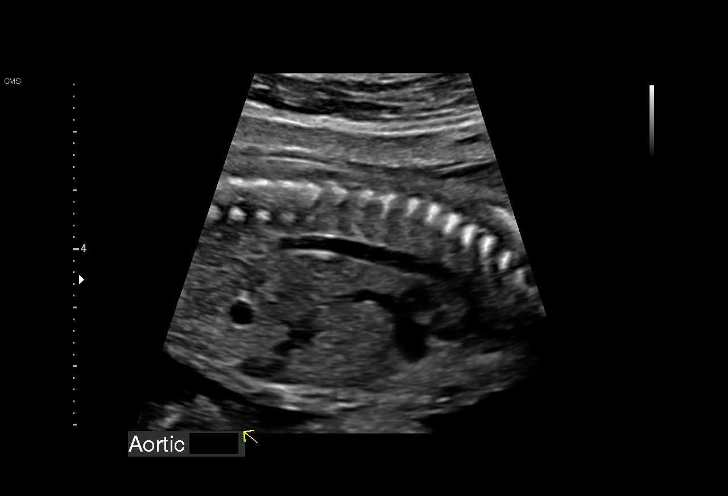
[im 62/139]
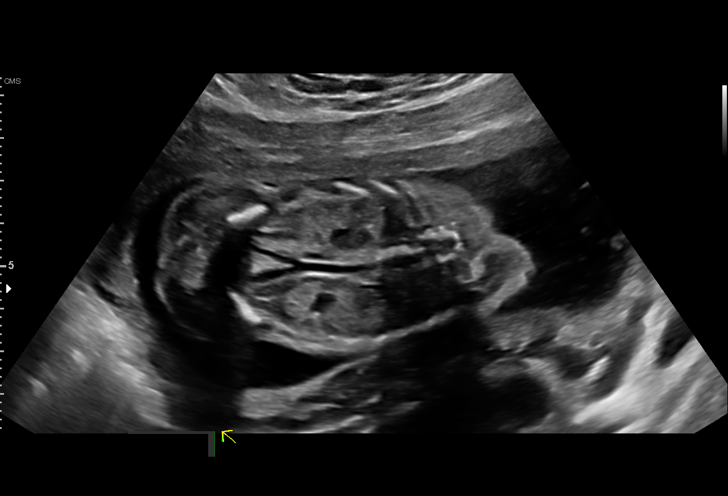
[im 77/139]
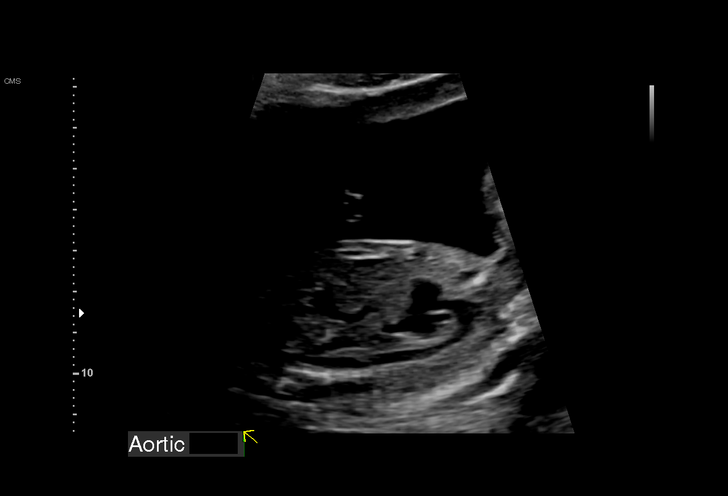
[im 87/139]
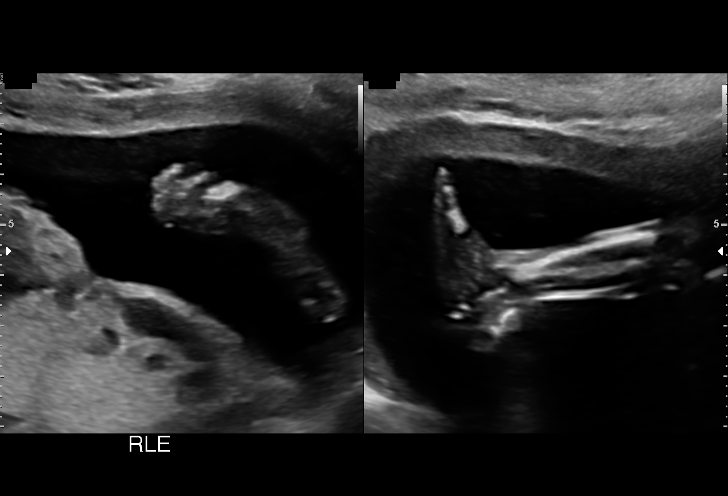
[im 98/139]
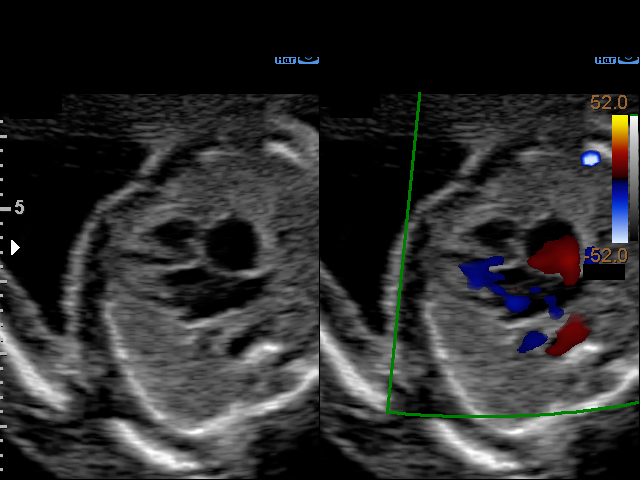
[im 113/139]
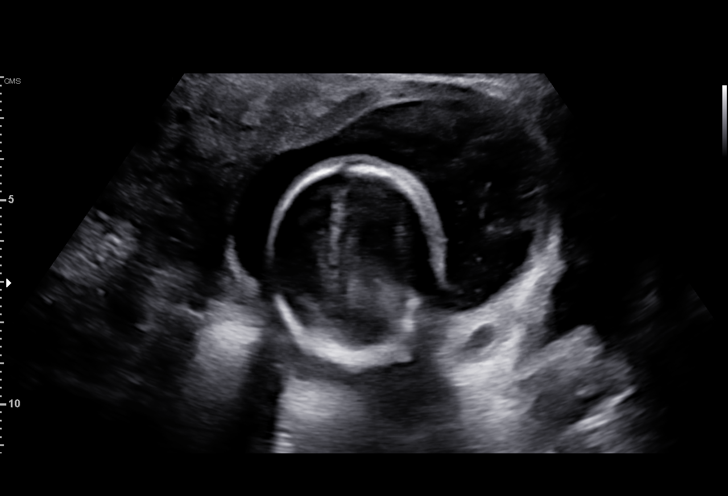
[im 123/139]
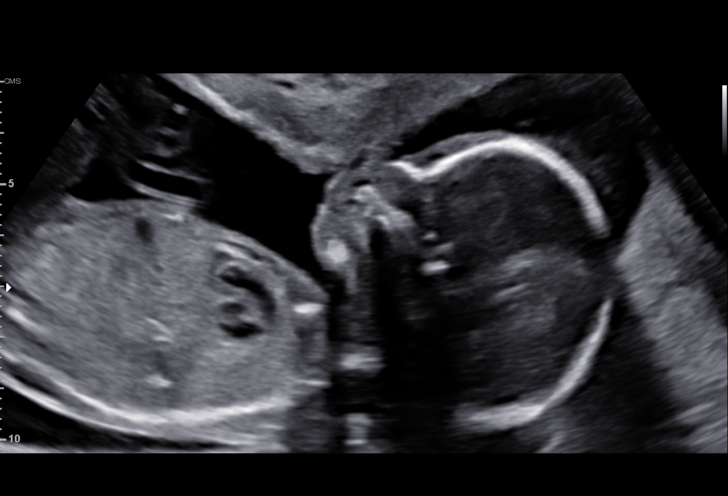
[im 133/139]
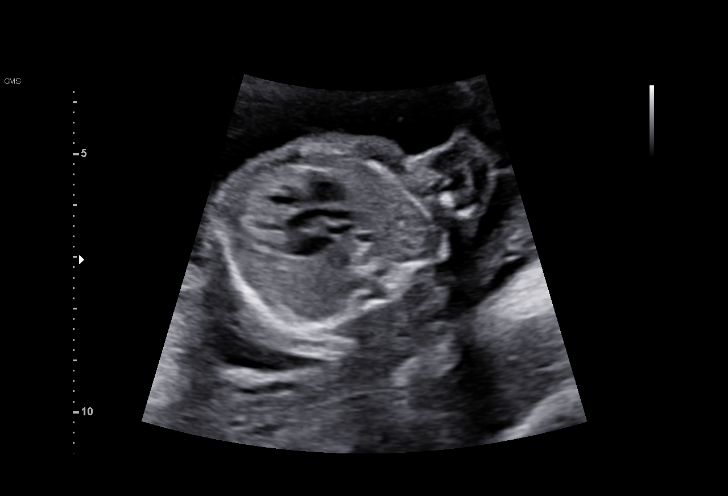

[12 of 28 positions shown; findings below may reference images not displayed]

Indications

 Substance use affecting pregnancy,
 antepartum (THC, amphetamines - 07/28/20)
 19 weeks gestation of pregnancy
 Antenatal screening for malformations
 Poor obstetric history: Previous gestational
 HTN
Fetal Evaluation

 Num Of Fetuses:         1
 Fetal Heart Rate(bpm):  153
 Cardiac Activity:       Observed
 Presentation:           Cephalic
 Placenta:               Posterior circumvallate placenta
 P. Cord Insertion:      Visualized

 Amniotic Fluid
 AFI FV:      Within normal limits

                             Largest Pocket(cm)

Biometry

 BPD:        45  mm     G. Age:  19w 4d         76  %    CI:        72.28   %    70 - 86
                                                         FL/HC:      18.2   %    16.1 -
 HC:      168.4  mm     G. Age:  19w 3d         67  %    HC/AC:      1.17        1.09 -
 AC:       144   mm     G. Age:  19w 5d         70  %    FL/BPD:     68.2   %
 FL:       30.7  mm     G. Age:  19w 4d         62  %    FL/AC:      21.3   %    20 - 24
 HUM:      30.3  mm     G. Age:  20w 0d         76  %
 CER:      20.4  mm     G. Age:  19w 4d         64  %
 NFT:       5.3  mm

 LV:        5.7  mm
 CM:        2.9  mm

 Est. FW:     303  gm    0 lb 11 oz      81  %
OB History

 Gravidity:    3         Term:   2        Prem:   0        SAB:   0
 TOP:          0       Ectopic:  0        Living: 2
Gestational Age

 LMP:           19w 0d        Date:  06/21/20                 EDD:   03/28/21
 U/S Today:     19w 4d                                        EDD:   03/24/21
 Best:          19w 0d     Det. By:  LMP  (06/21/20)          EDD:   03/28/21
Anatomy

 Cranium:               Appears normal         LVOT:                   Appears normal
 Cavum:                 Appears normal         Aortic Arch:            Appears normal
 Ventricles:            Appears normal         Ductal Arch:            Appears normal
 Choroid Plexus:        Appears normal         Diaphragm:              Appears normal
 Cerebellum:            Appears normal         Stomach:                Appears normal, left
                                                                       sided
 Posterior Fossa:       Appears normal         Abdomen:                Appears normal
 Nuchal Fold:           Appears normal         Abdominal Wall:         Appears nml (cord
                                                                       insert, abd wall)
 Face:                  Appears normal         Cord Vessels:           Appears normal (3
                        (orbits and profile)                           vessel cord)
 Lips:                  Appears normal         Kidneys:                Appear normal
 Palate:                Appears normal         Bladder:                Appears normal
 Thoracic:              Appears normal         Spine:                  Appears normal
 Heart:                 VSD, muscular          Upper Extremities:      Appears normal
 RVOT:                  Appears normal         Lower Extremities:      Appears normal
Targeted Anatomy

 Central Nervous System
 Cereb./Vermis:         Appears normal

 Head/Neck
 Nasal Bone:            Present                Mandible:               Appears normal
 Orbits/Eyes:           Nml w/ lenses          Maxilla:                Appears normal

 Thorax
 SVC:                   Appears normal         3 V Trachea View:       Appears normal
 3 Vessel View:         Appears normal         IVC:                    Appears normal

 Extremities
 Lt Humerus:            Appears normal         Lt Femur:               Appears normal
 Rt Humerus:            Appears normal         Rt Femur:               Appears normal
 Lt Forearm:            Appears normal         Lt Lower Leg:           Appears normal
 Rt Forearm:            Appears normal         Rt Lower Leg:           Appears normal
 Lt Hand:               Open hand nml          Lt Foot:                Nml heel/foot
 Rt Hand:               Open hand nml          Rt Foot:                Nml heel/foot
 Other
 Genitalia:             Normal Male
Cervix Uterus Adnexa

 Cervix
 Length:           3.44  cm.
 Normal appearance by transabdominal scan.

 Uterus
 No abnormality visualized.

 Right Ovary
 Not visualized.

 Left Ovary
 Not visualized.

 Cul De Sac
 No free fluid seen.

 Adnexa
 No abnormality visualized.
Comments

 This patient was seen for a detailed fetal anatomy scan due
 to maternal polysubstance use.
 She denies any significant past medical history and denies
 any problems in her current pregnancy.
 She has not had any screening tests for fetal aneuploidy
 drawn in her current pregnancy.
 She was informed that the fetal growth and amniotic fluid
 level were appropriate for her gestational age.
 In some views of the fetal heart, a possible VSD was noted.
 There were no other obvious fetal anomalies noted today.
 The patient was informed that anomalies may be missed due
 to technical limitations. If the fetus is in a suboptimal position
 or maternal habitus is increased, visualization of the fetus in
 the maternal uterus may be impaired.
 Due to the possible VSD, the patient was referred to [HOSPITAL]
 pediatric cardiology for a fetal echocardiogram.
 As she has not had any screening tests for fetal aneuploidy
 drawn in her pregnancy, the patient had a cell free DNA test
 (Materni T21) drawn following today's ultrasound exam.
 She will return in 4 weeks for another ultrasound to reassess
 the fetal anatomy and growth.

## 2022-07-21 IMAGING — US US MFM OB FOLLOW-UP
1 series · 13 of 28 positions shown · non-contrast
Comparison: none

[Series 1: us mfm ob follow-up · 32 acquisitions, 13 frames shown]
[im 2/32]
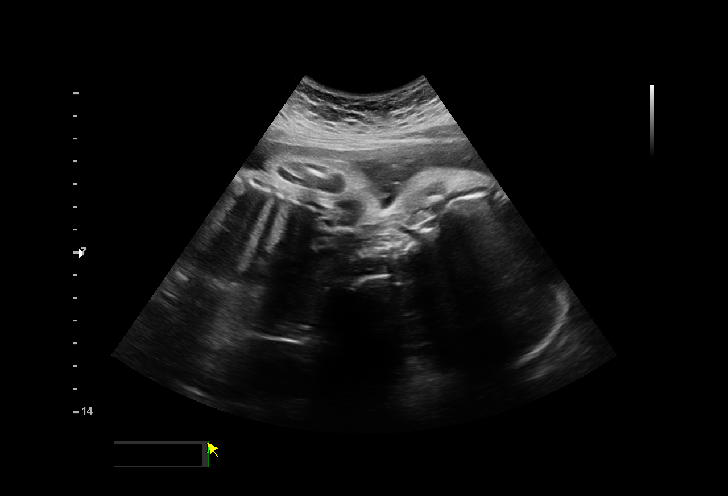
[im 4/32]
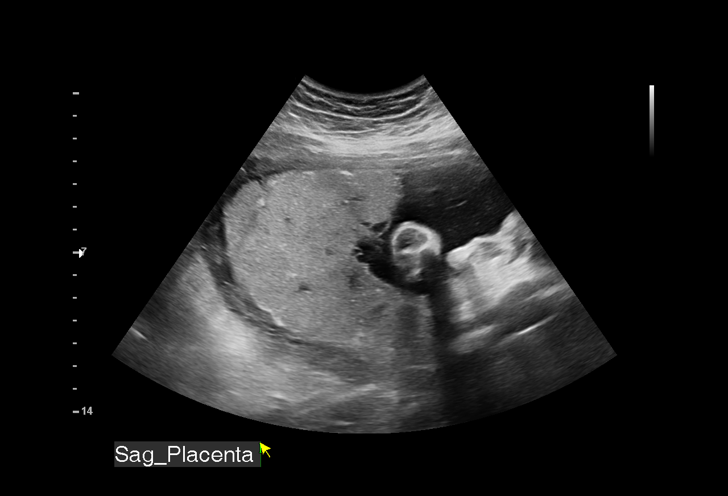
[im 6/32]
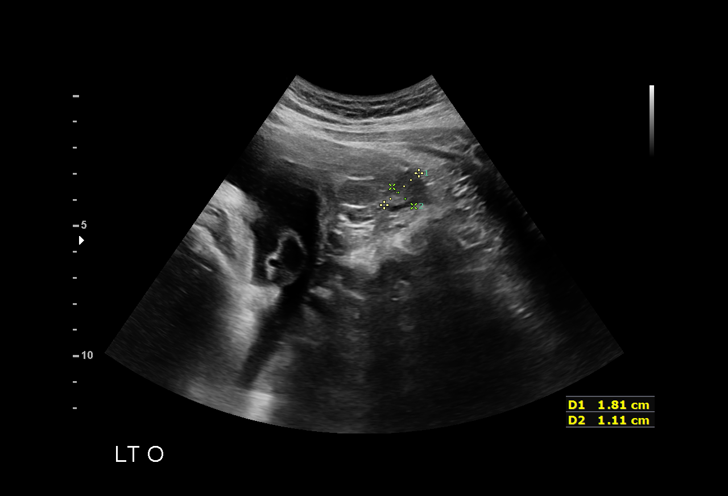
[im 9/32]
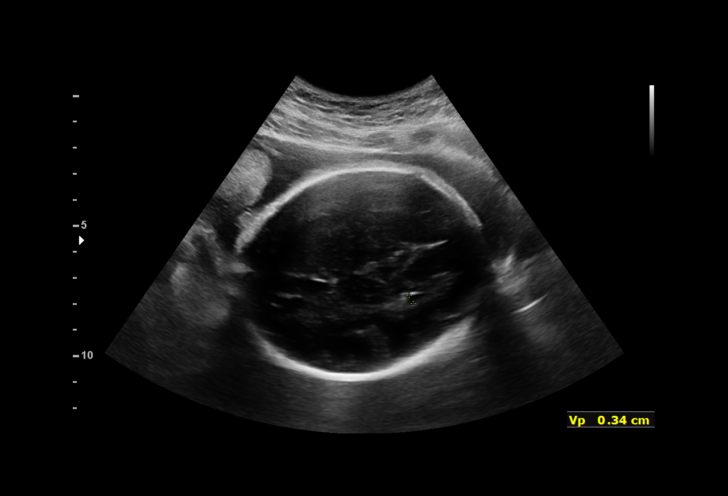
[im 11/32]
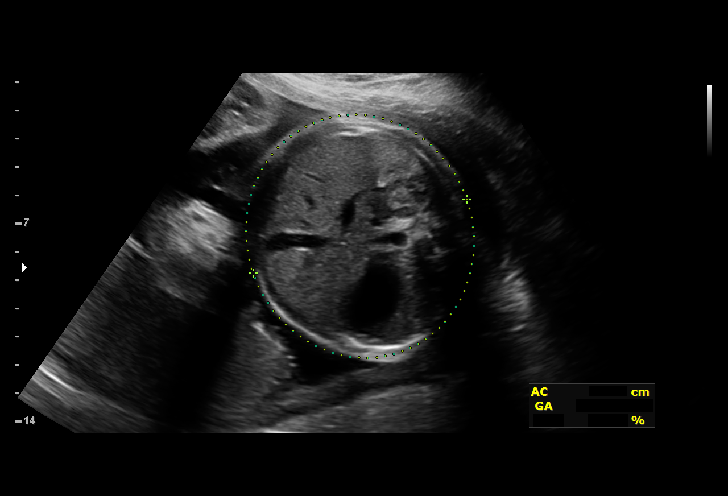
[im 13/32]
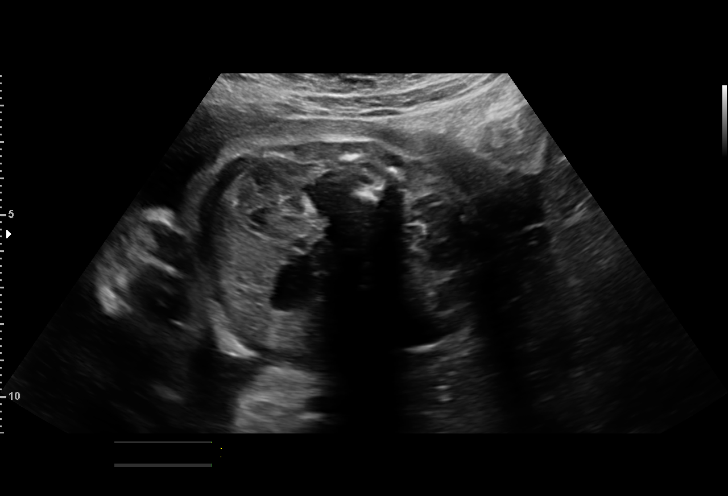
[im 17/32]
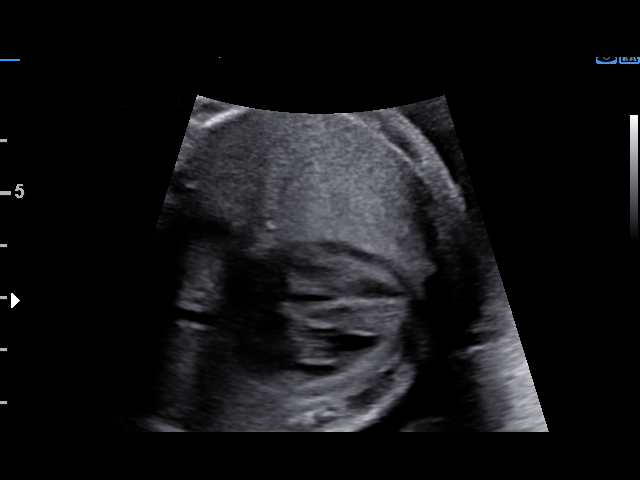
[im 19/32]
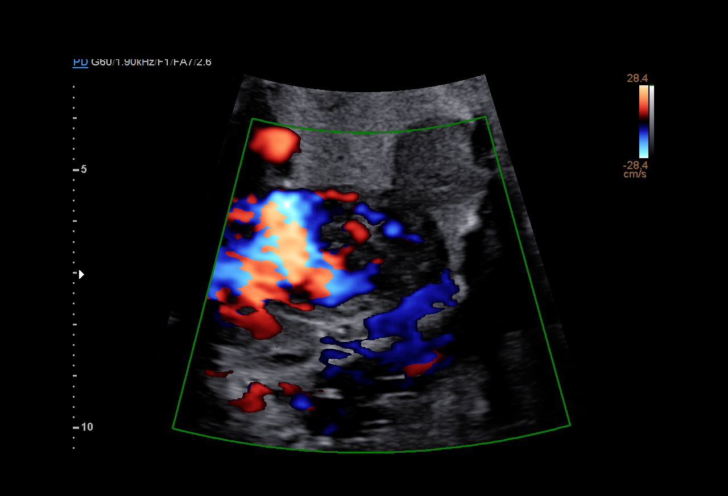
[im 21/32]
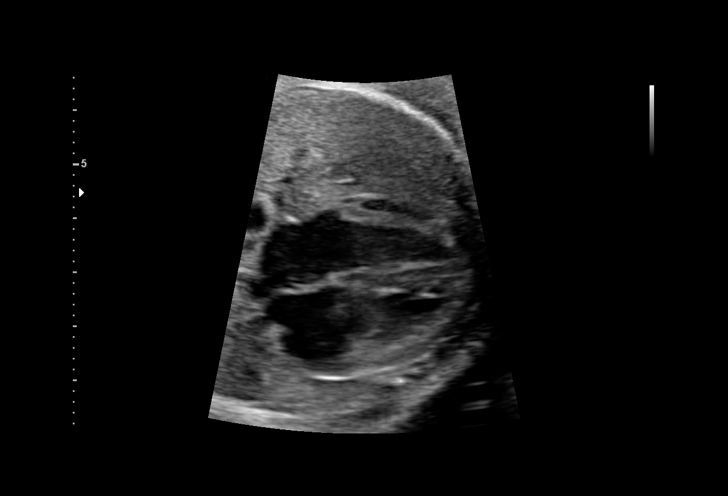
[im 23/32]
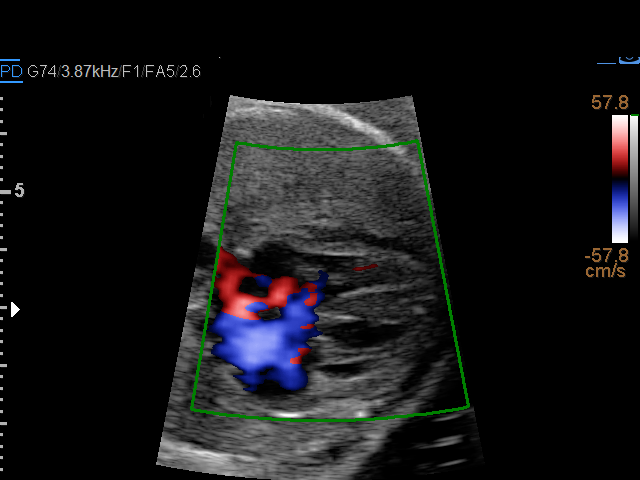
[im 26/32]
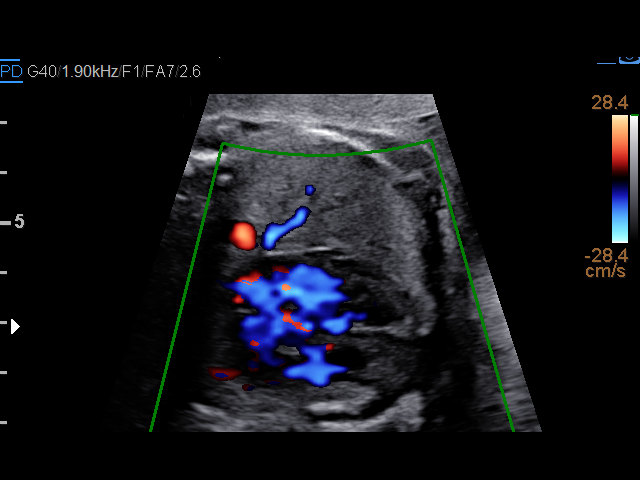
[im 28/32]
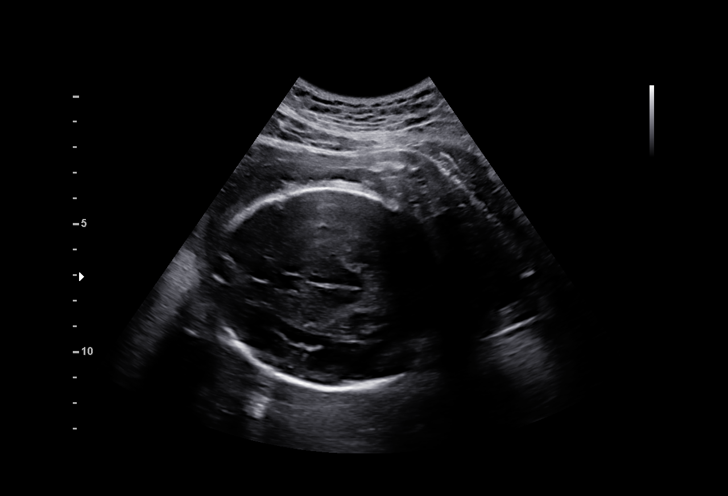
[im 30/32]
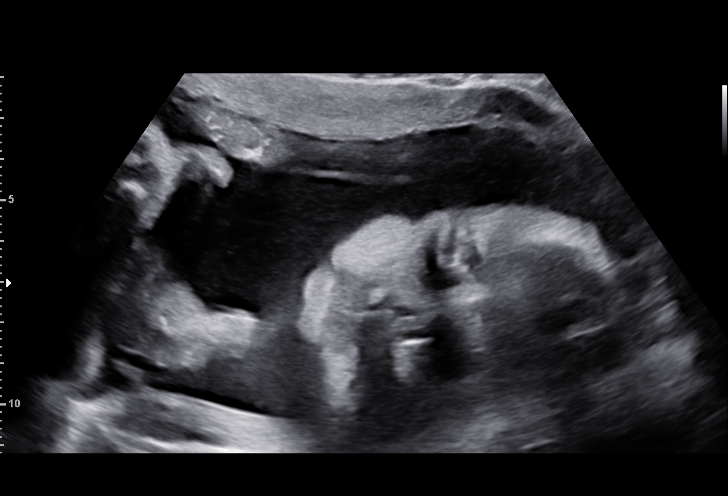

[13 of 28 positions shown; findings below may reference images not displayed]

Indications

 Substance use affecting pregnancy,
 antepartum (THC, amphetamines - 07/28/20)
 30 weeks gestation of pregnancy
 Antenatal screening for malformations
 Poor obstetric history: Previous gestational
 HTN
 Fetal cardiac anomaly affecting pregnancy,
 antepartum- VSD
Fetal Evaluation

 Num Of Fetuses:         1
 Fetal Heart Rate(bpm):  157
 Cardiac Activity:       Observed
 Presentation:           Cephalic
 Placenta:               Posterior
 P. Cord Insertion:      Previously Visualized

 Amniotic Fluid
 AFI FV:      Within normal limits

 AFI Sum(cm)     %Tile       Largest Pocket(cm)
 12.5            34

 RUQ(cm)       RLQ(cm)       LUQ(cm)        LLQ(cm)

Biometry
 BPD:      78.2  mm     G. Age:  31w 3d         76  %    CI:        75.73   %    70 - 86
                                                         FL/HC:      21.6   %    19.2 -
 HC:      284.9  mm     G. Age:  31w 2d         47  %    HC/AC:      1.09        0.99 -
 AC:      261.7  mm     G. Age:  30w 2d         50  %    FL/BPD:     78.8   %    71 - 87
 FL:       61.6  mm     G. Age:  32w 0d         83  %    FL/AC:      23.5   %    20 - 24

 LV:        3.4  mm

 Est. FW:    1441  gm    3 lb 11 oz      69  %
OB History

 Gravidity:    3         Term:   2        Prem:   0        SAB:   0
 TOP:          0       Ectopic:  0        Living: 2
Gestational Age

 LMP:           30w 1d        Date:  06/21/20                 EDD:   03/28/21
 U/S Today:     31w 2d                                        EDD:   03/20/21
 Best:          30w 1d     Det. By:  LMP  (06/21/20)          EDD:   03/28/21
Anatomy

 Cranium:               Appears normal         LVOT:                   Previously seen
 Cavum:                 Previously seen        Aortic Arch:            Previously seen
 Ventricles:            Appears normal         Ductal Arch:            Previously seen
 Choroid Plexus:        Previously seen        Diaphragm:              Previously seen
 Cerebellum:            Previously seen        Stomach:                Appears normal, left
                                                                       sided
 Posterior Fossa:       Previously seen        Abdomen:                Previously seen
 Nuchal Fold:           Previously seen        Abdominal Wall:         Previously seen
 Face:                  Orbits and profile     Cord Vessels:           Previously seen
                        previously seen
 Lips:                  Previously seen        Kidneys:                Appear normal
 Palate:                Previously seen        Bladder:                Appears normal
 Thoracic:              Previously seen        Spine:                  Previously seen
 Heart:                 VSD, muscular          Upper Extremities:      Previously seen
 RVOT:                  Previously seen        Lower Extremities:      Previously seen

 Other:  VC, 3VV and 3VTV previously visualized.
Cervix Uterus Adnexa

 Cervix
 Not visualized (advanced GA >58wks)

 Right Ovary
 Visualized.

 Left Ovary
 Visualized.
Comments

 This patient was seen for a follow up growth scan due to a
 history of maternal substance use.  A possible VSD was
 noted during her last ultrasound exam.  The patient had a
 fetal echocardiogram performed with [HOSPITAL] pediatric
 cardiology who noted a trivial perimembranous VSD which
 may close either prior to birth or early after birth.  They
 recommended that the patient have a postdelivery
 echocardiogram of the baby's heart at 1 month of life.  She
 denies any problems since her last exam.
 She was informed that the fetal growth and amniotic fluid
 level appears appropriate for her gestational age.
 The patient was encouraged to keep and attend all of her
 prenatal appointments as scheduled.
 A follow-up growth scan was scheduled in 5 weeks.
